# Patient Record
Sex: Female | Born: 1965 | Race: White | Hispanic: No | Marital: Married | Smoking: Never smoker
Health system: Southern US, Community
[De-identification: ages and names within clinical notes are randomized; demographics above are authoritative.]

## PROBLEM LIST (undated history)

## (undated) DIAGNOSIS — N83209 Unspecified ovarian cyst, unspecified side: Secondary | ICD-10-CM

## (undated) HISTORY — DX: Unspecified ovarian cyst, unspecified side: N83.209

---
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
## 1999-10-02 HISTORY — PX: EXPLORATORY LAPAROTOMY: SUR591

## 1999-10-17 ENCOUNTER — Other Ambulatory Visit: Admission: RE | Admit: 1999-10-17 | Discharge: 1999-10-17 | Payer: Self-pay | Admitting: Obstetrics and Gynecology

## 2000-03-29 ENCOUNTER — Encounter: Payer: Self-pay | Admitting: Obstetrics and Gynecology

## 2000-03-29 ENCOUNTER — Ambulatory Visit (HOSPITAL_COMMUNITY): Admission: RE | Admit: 2000-03-29 | Discharge: 2000-03-29 | Payer: Self-pay | Admitting: Obstetrics and Gynecology

## 2000-04-25 ENCOUNTER — Ambulatory Visit (HOSPITAL_COMMUNITY): Admission: RE | Admit: 2000-04-25 | Discharge: 2000-04-25 | Payer: Self-pay | Admitting: Obstetrics and Gynecology

## 2000-04-25 HISTORY — PX: PELVIC LAPAROSCOPY: SHX162

## 2000-09-03 ENCOUNTER — Encounter (INDEPENDENT_AMBULATORY_CARE_PROVIDER_SITE_OTHER): Payer: Self-pay

## 2000-09-03 ENCOUNTER — Inpatient Hospital Stay (HOSPITAL_COMMUNITY): Admission: RE | Admit: 2000-09-03 | Discharge: 2000-09-07 | Payer: Self-pay | Admitting: Obstetrics and Gynecology

## 2000-09-03 HISTORY — PX: ABDOMINAL SURGERY: SHX537

## 2000-09-03 HISTORY — PX: OOPHORECTOMY: SHX86

## 2000-09-11 ENCOUNTER — Inpatient Hospital Stay (HOSPITAL_COMMUNITY): Admission: RE | Admit: 2000-09-11 | Discharge: 2000-09-13 | Payer: Self-pay | Admitting: Obstetrics and Gynecology

## 2000-09-11 ENCOUNTER — Encounter: Payer: Self-pay | Admitting: Obstetrics and Gynecology

## 2000-11-04 ENCOUNTER — Other Ambulatory Visit: Admission: RE | Admit: 2000-11-04 | Discharge: 2000-11-04 | Payer: Self-pay | Admitting: Obstetrics and Gynecology

## 2003-07-20 ENCOUNTER — Other Ambulatory Visit: Admission: RE | Admit: 2003-07-20 | Discharge: 2003-07-20 | Payer: Self-pay | Admitting: Obstetrics and Gynecology

## 2007-04-14 ENCOUNTER — Emergency Department (HOSPITAL_COMMUNITY): Admission: EM | Admit: 2007-04-14 | Discharge: 2007-04-14 | Payer: Self-pay | Admitting: Family Medicine

## 2011-02-16 NOTE — Discharge Summary (Signed)
[facility]  Patient:    Sophia Martinez, Sophia Martinez                       MRN: [id number] Adm. Date:  [id number] Disc. Date: [id number] Attending:  Sharon Mt                           Discharge Summary  HISTORY OF PRESENT ILLNESS:   Patient is a 45 year old female who was admitted to the hospital with dense pelvic adhesive disease for exploratory surgery. On the day of admission she was taken to the operating room and exploratory laparotomy was done. Findings were dense adhesions of small and large bowel and to the pelvis. They were all systematically lysed. After this, peritubular paraovarian adhesions very encountered on the right. There was really not a salvageable adnexa on the right so she also underwent right salpingo- oophorectomy. Left tube was patent and left ovary appeared to be free of disease other than some pelvic adhesive disease.  Postoperatively she did  have an ileus which was understandable based on the findings of surgery and the amount of adhesiolysis done. She was kept on IVs, finally given a Dulcolax suppository, and by the fourth postoperative day, she was tolerating a normal diet, passing gas, and had a bowel movement.  Discharge diet was soft to advance as tolerated. Discharge activity was ambulatory. Wound care was routine. Her staples were removed when she was discharged.  FOLLOWUP:                     She is to return to our office in four weeks for further followup.  DISCHARGE MEDICATIONS:        Darvocet-N 100 on a p.r.n. basis for pain relief.  CONDITION ON DISCHARGE:       Improved.  DISCHARGE DIAGNOSIS:          1. Pelvic pain.                               2. Pelvic adhesive disease.  OPERATIONS:                   1. Exploratory laparotomy with lysis of small                                  and large bowel adhesions.                               2. Lysis of pelvic adhesions.                               3.  Right salpingo-oophorectomy. DD:  09/07/00 TD:  09/07/00 Job: [id number]

## 2011-02-16 NOTE — Op Note (Signed)
[facility]  Patient:    Sophia Martinez, Sophia Martinez                       MRN: [id number] Proc. Date: 09/03/00 Adm. Date:  [id number] Attending:  Sharon Mt                           Operative Report  PREOPERATIVE DIAGNOSES:       1. Chronic pelvic pain, especially right                                  lower quadrant.                               2. Pelvic inflammatory disease.                               3. Pelvic adhesions.                               4. Status post ruptured appendix.  POSTOPERATIVE DIAGNOSES:      1. Chronic pelvic pain, especially right                                  lower quadrant.                               2. Pelvic inflammatory disease.                               3. Pelvic adhesions.                               4. Status post ruptured appendix.  OPERATION:                    Exploratory laparotomy, lysis of small-bowel                               and pelvic adhesions, right                               salpingo-oophorectomy.  SURGEON:                      Daniel L. Eda Paschal, M.D.  ASSISTANT:                    Katy Fitch, M.D.  ANESTHESIA:                   General endotracheal anesthesia.  FINDINGS:                     At the time of laparotomy, findings were very similar to what was seen when she had had her previous laparoscopy.  The uterus was adherent posteriorly with obliteration of the cul-de-sac to the sigmoid colon.  The patients left fallopian tube was adherent to sigmoid colon.  Her tube, which had been opened at the time of laparoscopy, was still patent.  Fimbria could be seen when indigo carmine was introduced.  There was spill of dye from the left tube with a completely patent tube.  The patients left ovary was encased in dense adhesions and was not really amenable to freeing up without risk of ruining blood supply to the ovary and/or bowel injury.  In the right lower quadrant the  patient had multiple loops of small-bowel adherent to the adnexa and to the right lateral sidewall.  As these adhesions were freed up, there was evidence of small-bowel doubling over on itself and area where small-bowel kissed itself consistent with her previous ruptured appendix.  The right fallopian tube had a severely dilated hydrosalpinx.  The ovary was densely adherent both to the tube and to the sidewall and it was not felt that either were salvageable short of risking continuation of a chronic pelvic pain.  The ileocecal junction was identified and the cecum itself was normal.  The omentum was densely adherent both to the parietal peritoneum and to the above structures.  ESTIMATED BLOOD LOSS:         200 cc.  DESCRIPTION OF PROCEDURE:     After adequate general endotracheal anesthesia, the patient was placed in the supine position, prepped and draped in the usual sterile manner.  A Foley catheter was inserted in the patients bladder.  A pediatric Foley was put into the uterus to inject dye.  A Pfannenstiel incision was made. The fascia was opened transversely. The peritoneum was entered vertically. Subcutaneous bleeders were clamped and bovied as encountered.  When the peritoneal cavity was opened, the above findings were noted.  Taking careful care, all small-bowel adhesions were sharply lysed. Not only did it take significant time to free up the small-bowel from the right lower quadrant and the right lateral wall, but in addition, it had to be freed up from itself as it was doubled over on itself and certainly could be a major source of her right lower quadrant pain.  Omental adhesions also had to be lysed in order to proceed with the above. The total time of this part of the procedure was at least an hour and a half. The sigmoid colon was then carefully freed up from the left adnexa and from the posterior wall of the uterus so that the patient now had a free cul-de-sac.  This  took significant time as well.  The left tube was examined.  It was still patent from a previous surgery and fimbria looked normal. There really was no way to create any kind of plane to free the ovary up, so although the patient now had a patent tube on the left, it was felt that her chances of conceiving were diminished because of the inability to get normal oviductal transport.  It was felt that any further dissection would severely risk losing the left ovary and because of the severe chronic PID on the right including the hydrosalpinx and the patients persistent right lower quadrant pain, it was felt that a right salpingo-oophorectomy had to be performed and therefore that was done next. The ureter could be identified by opening the peritoneal space on the right. The round ligament was clamped, cut, and suture ligated with 0 Vicryl.  After the ureter had been identified and isolated, the right infundibulopelvic ligament was clamped, cut and doubly suture ligated with  0 Vicryl.  The ovary and tube were freed up. This was very difficult because of the dense adhesions.  It was felt that the entire ovary was removed; however, because of all the fibrous scar tissue, it certainly was possible that a small ovarian remnant remains on her right side. The rest of the vascular attachments were clamped, cut, and suture ligated with 0 Vicryl.  Copious irrigation was done with Ringers lactate.  Two sponge, needle and instrument counts were correct. A significant amount of fluid was left in the peritoneal cavity to float the bowel to try to prevent readherence and then the peritoneum was closed with 0 Vicryl.  Sub- and superfascial spaces were copiously irrigated with Ringers lactate and then the fascia was closed with two running o Vicryl sutures and the skin was closed with staples.  The estimated blood loss for the entire procedure was 200 cc with none replaced.  The patient tolerated the  procedure well and left the operating room in satisfactory condition draining clear urine from her Foley catheter. DD:  09/03/00 TD:  09/03/00 Job: [id number]

## 2011-02-16 NOTE — Discharge Summary (Signed)
[facility]  Patient:    Sophia Martinez, Sophia Martinez                       MRN: [id number] Adm. Date:  [id number] Disc. Date: [id number] Attending:  Sharon Mt Dictator:   Antony Contras, [facility]                           Discharge Summary  DISCHARGE DIAGNOSES:          1. Chronic pelvic pain, especially right lower quadrant.                               2. Pelvic inflammatory disease.                               3. Pelvic adhesions.                               4. Status post ruptured appendix.  PROCEDURES:                   Exploratory laparotomy, lysis of small bowel and pelvic adhesions, right salpingo-oophorectomy.  HISTORY OF PRESENT ILLNESS:   The patient is a 45 year old nulligravida, who enters the hospital with persistent right lower quadrant pain that radiates to her right leg, and this usually occurs the week before her period, but she also has it with menses, and it is becoming increasingly uncomfortable and has been associated with dyspareunia.  When she was 45 years old, she did have a severe ruptured appendix which apparently was ruptured for about a week before she had surgery.  She also did have severe gangrene of the appendix as well as generalized peritonitis, and she was at one point critically ill.  She did have a laparoscopy in July 2001, which showed dense pelvic adhesions involving the ovaries, fallopian tubes, small and large bowel.  During this procedure, a significant amount of adhesions involving the left adnexa were lysed but because of the denseness of the adhesions and the inability to visualize the right adnexa, it was thought possible to free up the right side, and she continued to have pain.  She now presents for exploratory laparotomy with a possible right salpingo-oophorectomy.  HOSPITAL COURSE AND TREATMENT:                The patient was admitted on September 03, 2000, where exploratory laparotomy, lysis of small  bowel and pelvic adhesions, and right salpingo-oophorectomy was performed by Dr. Eda Paschal, assisted by Dr. Penni Homans, under general endotracheal anesthesia.  Estimated blood loss was 200 cc.  The patient tolerated the procedure well.  Postoperative temperature maximum was 100.9.  She did have no difficulty voiding, but she did have some difficulty passing flatus.  This was relieved with Dulcolax suppository.  Her CBC with hematocrit 31.7, hemoglobin 11.2, WBC 6.2, platelets 266.  She was able to be discharged on her fourth postoperative day in satisfactory condition.  DISPOSITION:                  The patient did leave the hospital prior to receiving discharge instructions from the nursing staff.  Staff member attempted to call patient, but the contact phone number was a cell phone, and the recording said  she was unavailable.  The patient had been instructed by Dr. Eda Paschal to follow up in the office postoperatively. DD:  10/07/00 TD:  10/07/00 Job: [id number]

## 2011-02-16 NOTE — H&P (Signed)
[facility]  Patient:    Sophia Martinez, Sophia Martinez                       MRN: [id number] Attending:  Rande Brunt. Eda Paschal, M.D.                         History and Physical  CHIEF COMPLAINT:              Pelvic pain with pelvic adhesive disease.  HISTORY OF PRESENT ILLNESS:   The patient is a 45 year old nulligravida who enters the hospital with persistent right lower quadrant pain that radiates to her right leg.  It usually occurs the week before her period, but she also has it with her period.  It is becoming increasingly uncomfortable for her.  It has been associated with dyspareunia.  Her history is significant in that, when she was 45 years old, she had a severely ruptured appendix, which evidently stayed ruptured for about a week before she had surgery.  When they did her surgery, she had severe gangrene of the appendix as well as generalized peritonitis, and there were some concerns about whether she was going to survive.  As a result of this, she underwent open laparoscopy by me in July 2001.  The findings were dense pelvic adhesive disease involving the ovaries, fallopian tubes, small and large bowel.  Through the laparoscope, we were able to lyse a significant amount of adhesions involving her left adnexa but, because of the denseness of the adhesions and the inability to even visualize the right adnexa, it was not possible to free up the right side at all, and she has continued to have pain after her surgery.  She now enters for definitive surgery.  Because she would like to have children, she will not undergo total abdominal hysterectomy and bilateral salpingo-oophorectomy, but we will lyse adhesions.  If it is possible to open a blocked fallopian tube, we will do so.  She has also given me permission to remove her right ovary and tube if it is not salvageable so that she will not have to deal with the above pain.  PAST MEDICAL HISTORY:          Ruptured appendix at 45 years old.  PRESENT MEDICATIONS:          None.  ALLERGIES:                    None.  FAMILY HISTORY:               Uterine cancer on her mothers side.  SOCIAL HISTORY:               She is a nonsmoker and nondrinker.  REVIEW OF SYSTEMS:            HEENT: Negative except for headaches.  CARDIAC: Negative.  RESPIRATORY: Negative.  GASTROINTESTINAL: Negative except for lower abdominal pain.  GENITOURINARY: Negative.  NEUROMUSCULAR: Negative. ENDOCRINE: Negative.  PHYSICAL EXAMINATION:  GENERAL:                      Well-developed, well-nourished female in no acute distress.  VITAL SIGNS:                  Blood pressure 110/70, pulse 80 and regular, respirations 16 and unlabored.  She is afebrile.  HEENT:  Within normal limits.  NECK:                         Supple.  Trachea midline.  Thyroid not enlarged.  LUNGS:                        Clear to P&A.  HEART:                        No thrills, heaves or murmurs.  BREASTS:                      No masses.  ABDOMEN:                      Soft without guarding, rebound or masses.  PELVIC:                       External and vaginal is within normal limits. The cervix is clean.  Pap was obtained.  The uterus is mid position, normal size and shape, somewhat fixed.  The right adnexa is slightly enlarged and tender.  The left adnexa is palpably normal.  Rectovaginal is confirmatory.  EXTREMITIES:                  Within normal limits.  ADMISSION IMPRESSION:         1. Chronic pelvic pain, right sided.                               2. Pelvic adhesive disease secondary to ruptured                                  appendix.  PLAN:                         Exploratory laparotomy with appropriate surgery. DD:  09/03/00 TD:  09/03/00 Job: [id number]

## 2011-02-16 NOTE — H&P (Signed)
[facility]  Patient:    Sophia Martinez, Sophia Martinez                       MRN: [id number] Adm. Date:  [id number] Attending:  Sharon Mt                         History and Physical  CHIEF COMPLAINT:  Nausea and vomiting.  HISTORY OF PRESENT ILLNESS:  The patient is a ______-year-old female who is now nine days status post exploratory laparotomy with multiple lysis of small bowel adhesions in the pelvis as well as right salpingo-oophorectomy, who had been discharged doing well, passing gas, having bowel movements, and without nausea and vomiting.  The day prior to this admission, she started noticing some increased constipation.  She went to sleep and she woke up at 2 in the morning and from 2 oclock from 10 oclock when I saw her in the office, she had persistent nausea and vomiting.  She denies any fever.  She denies any pain other than discomfort in her stomach from continually throwing up. Findings at the time of her surgery, nine days ago, was massive small-bowel adhesions into the pelvis as a result of a previous ruptured appendix with peritonitis that was sustained when she was 45 years old.  The patient is also having no urinary symptoms.  PAST MEDICAL HISTORY:  See above and see previous history and physical from admission nine days ago.  FAMILY HISTORY:  Noncontributory.  SOCIAL HISTORY:  Noncontributory.  REVIEW OF SYSTEMS:  HEENT:  Negative.  CARDIAC:  Negative.  RESPIRATORY: Negative.  GASTROINTESTINAL:  See above.  GENITOURINARY:  See above. NEUROMUSCULAR:  Negative.  ENDOCRINE:  Negative.  PHYSICAL EXAMINATION:  GENERAL:  The patient is a ill-appearing female, having severe nausea and vomiting.  VITAL SIGNS:  Blood pressure is 130/70, pulse is 80 and regular, temperature is 98.2, respirations are 16 and nonlabored.  HEENT:  Within normal limits.  NECK:  Supple.  Trachea in the midline.  Thyroid is not enlarged.  LUNGS:  Clear to  P&A.  HEART:  No thrills, heaves, or murmurs.  BREASTS:  No masses.  ABDOMEN:  Appears somewhat distended.  Breath sounds are actually normoactive in spite of the distention.  Incision is healing well.  PELVIC:  External and vaginal within normal limits.  Cervix is clean.  Uterus is anteverted, normal size and shape.  Adnexa are palpable normal. Rectovaginal is confirmatory.  ADMITTING IMPRESSION:  Postoperative nausea and vomiting with partial small-bowel obstruction, suspected due to previous surgery.  PLAN:  Admission with IVs.  Electrolytes and abdominal films to be obtained as soon as she is admitted. DD:  09/12/00 TD:  09/12/00 Job: [id number]

## 2011-02-16 NOTE — Op Note (Signed)
[facility]  Patient:    Sophia Martinez, Sophia Martinez                       MRN: [id number] Proc. Date: 04/25/00 Adm. Date:  [id number] Disc. Date: [id number] Attending:  Sharon Mt                           Operative Report  PREOPERATIVE DIAGNOSIS:       Pelvic pain, dyspareunia, and probable                               hydrosalpinx bilaterally based on ultrasound.  POSTOPERATIVE DIAGNOSIS:      Pelvic pain, pelvic adhesions, and bilateral                               hydrosalpinx.  OPERATION:                    Diagnostic laparoscopy with lysis of multiple                               pelvic adhesions, left salpingoneostomy.  SURGEON:                      Daniel L. Eda Paschal, M.D.  ASSISTANT:                    Douglass Rivers, M.D.  ANESTHESIA:                   General endotracheal.  FINDINGS:                     At the time of laparoscopy, the patient had significant pelvic adhesions.  There were some omental adhesions in the upper part of the abdomen which did not interfere with the procedure.  There were omental adhesions in the pelvis.  However, there were multiple loops of both small and large bowel that were adherent to the uterus into the left and right adnexa.  After extensive dissection, the left adnexa could be identified and the patient had a hydrosalpinx.  The left ovary was almost completely encased in adhesions and was retroperitoneal and could be seen, although was never completely uncovered.  On the right side, even after all small and large bowel adhesions had been lysed, the patient had no evidence of a right tube and ovary, and it was felt that they were both retroperitoneal and were encased in multiple adhesions.  There would really be no way without a laparotomy to dissect out the tube, and to Sophia Martinez with a right hydrosalpinx prior to an other investigation.  DESCRIPTION OF PROCEDURE:     After adequate general endotracheal  anesthesia, the patient was placed in the dorsolithotomy position, prepped and draped in the usual sterile manner.  A Jarcho cannula was inserted into the uterus.  The bladder was emptied with a Robinson catheter.  It was felt that it would be safer to do an open laparoscopy, and therefore, that was what was done.  A midline incision was made below the umbilicus and carried through the fascia. The fascia was then whip stitched with a 0 Vicryl suture.  The peritoneum was identified, entered with sharp  dissection, and a Hulka catheter was placed, and tied in place with a 0 Vicryl suture.  A pneumoperitoneum was created.  A 10 mm operating laparoscope attached to a camera was placed through this incision.  Two 5 mm ports were placed in the lower pelvis, one in the midline, and one in the left lower quadrant.  Through this, multiple instrumentation was placed during the procedure.  Through the operating channel of the scope, a ______ neodymium YAG laser was utilized with a GR6 foster tip over the bare fiber at 12 watts power and a ________ was used through this incision. Copious irrigation with Ringers lactate was done throughout the procedure.                                First, all the loops of bowel that were adherent to the pelvis were freed up, and this could be done carefully and meticulously, although it took approximately an hour of careful dissection to do so.  The laser was utilized throughout to do this.                                Once this had been accomplished, the left tube could easily be identified using the neodymium YAG laser, the distal portion of the tube could be identified where it was closed.  Indigo Carmine was introduced through the cervix.  It spilled to the end of the tube where it was closed, and then the tube was nicely opened with the YAG laser in such a fashion that it came pouring out.  The patient actually had very good fimbria on the left side.  The  area could be ________ back nicely, and it appeared that she had a nice patent tube on the left.                                In spite of multiple attempts, another 30-40 minutes of dissection on the right, it was never ever able to [facility] the right adnexa as it appeared to be severely retroperitoneal without any good tissue planes, and as adhesions were lysed, another layer of adhesions was encountered, and it was finally felt that this was beyond the scope of what the procedure could accomplish.  The procedure was therefore terminated. Copious irrigation was once more used with Ringers lactate.  All cul-de-sac fluid was removed.  The pneumoperitoneum was evacuated.  The 0 Vicryl suture subumbilically was tied in place and the three skin incisions were closed with   3-0 Monocryl.                                Estimated blood loss for the entire procedure was less than 50 cc and none replaced.  The patient tolerated the procedure well and left the operating room in satisfactory condition. DD:  04/25/00 TD:  04/28/00 Job: [id number]

## 2011-12-25 ENCOUNTER — Other Ambulatory Visit (HOSPITAL_COMMUNITY)
Admission: RE | Admit: 2011-12-25 | Discharge: 2011-12-25 | Disposition: A | Discharge status: Home / Self Care | Payer: 59 | Source: Ambulatory Visit | Attending: Obstetrics and Gynecology | Admitting: Obstetrics and Gynecology

## 2011-12-25 ENCOUNTER — Ambulatory Visit (INDEPENDENT_AMBULATORY_CARE_PROVIDER_SITE_OTHER): Payer: 59 | Admitting: Obstetrics and Gynecology

## 2011-12-25 ENCOUNTER — Encounter: Payer: Self-pay | Admitting: Obstetrics and Gynecology

## 2011-12-25 VITALS — BP 130/80 | Ht 64.0 in | Wt 148.0 lb

## 2011-12-25 DIAGNOSIS — N83209 Unspecified ovarian cyst, unspecified side: Secondary | ICD-10-CM | POA: Insufficient documentation

## 2011-12-25 DIAGNOSIS — Z833 Family history of diabetes mellitus: Secondary | ICD-10-CM

## 2011-12-25 DIAGNOSIS — Z01419 Encounter for gynecological examination (general) (routine) without abnormal findings: Secondary | ICD-10-CM

## 2011-12-25 DIAGNOSIS — N912 Amenorrhea, unspecified: Secondary | ICD-10-CM

## 2011-12-25 LAB — CBC WITH DIFFERENTIAL/PLATELET
Basophils Absolute: 0 K/uL (ref 0.0–0.1)
Basophils Relative: 0 % (ref 0–1)
Eosinophils Absolute: 0.1 K/uL (ref 0.0–0.7)
Eosinophils Relative: 2 % (ref 0–5)
HCT: 41.8 % (ref 36.0–46.0)
Hemoglobin: 13.6 g/dL (ref 12.0–15.0)
Lymphocytes Relative: 23 % (ref 12–46)
Lymphs Abs: 1.7 K/uL (ref 0.7–4.0)
MCH: 31.6 pg (ref 26.0–34.0)
MCHC: 32.5 g/dL (ref 30.0–36.0)
MCV: 97.2 fL (ref 78.0–100.0)
Monocytes Absolute: 0.7 K/uL (ref 0.1–1.0)
Monocytes Relative: 9 % (ref 3–12)
Neutro Abs: 4.7 K/uL (ref 1.7–7.7)
Neutrophils Relative %: 66 % (ref 43–77)
Platelets: 323 K/uL (ref 150–400)
RBC: 4.3 MIL/uL (ref 3.87–5.11)
RDW: 12.1 % (ref 11.5–15.5)
WBC: 7.1 K/uL (ref 4.0–10.5)

## 2011-12-25 LAB — HDL CHOLESTEROL: HDL: 69 mg/dL

## 2011-12-25 LAB — CHOLESTEROL, TOTAL: Cholesterol: 179 mg/dL (ref 0–200)

## 2011-12-25 LAB — TSH: TSH: 0.798 u[IU]/mL (ref 0.350–4.500)

## 2011-12-25 LAB — FOLLICLE STIMULATING HORMONE: FSH: 82 m[IU]/mL

## 2011-12-25 MED ORDER — MEDROXYPROGESTERONE ACETATE 10 MG PO TABS: ORAL_TABLET | ORAL | Status: DC

## 2011-12-25 MED ORDER — ESTRADIOL 1 MG PO TABS: 1.0000 mg | ORAL_TABLET | Freq: Every day | ORAL | Status: DC

## 2011-12-25 NOTE — Progress Notes (Signed)
Patient is a 46 year old nulligravida who came to see me as a new patient for an annual exam and problem visit. She used to be a patient of mine but I have not seen her since 2004. She is continued routine GYN exams in [place] where she lives. Starting 3 years ago she had oligo amenorrhea with 1-3 periods a year. This was continued. She has had progressive weight gain, hot flashes, and vaginal dryness. She cannot sleep at night because of these symptoms. She had a mammogram last year. She has also had trouble breathing which she attributes to the weight gain. Her BMI is 25 so I don't believe that is the cause of her respiratory distress. When I used to see her for infertility we did a laparoscopy with a right ovarian cystectomy for a benign ovarian cyst. It was not endometriosis. We have had a go back later and remove her right ovary and tube for either adhesions or recurrent cysts. We will get her record and review it.  Physical examination:Kim Julian Reil present. HEENT within normal limits. Neck: Thyroid not large. No masses. Supraclavicular nodes: not enlarged. Breasts: Examined in both sitting and lying  position. No skin changes and no masses. Abdomen: Soft no guarding rebound or masses or hernia. Pelvic: External: Within normal limits. BUS: Within normal limits. Vaginal:within normal limits. Good estrogen effect. No evidence of cystocele rectocele or enterocele. Cervix: clean. Uterus: Normal size and shape. Adnexa: No masses. Rectovaginal exam: Confirmatory and negative. Extremities: Within normal limits.  Assessment: Menopausal symptoms. Dyspnea.  Plan: Appropriate lab work done. We will call patient with results. Start her on estradiol 1 mg daily. She will do medroxyprogesterone 5 mg day 1-12. She will start that in May. She will call in 6 weeks if not better. She will get her yearly mammogram. We referred her to clint young today for pulmonary evaluation.

## 2011-12-26 ENCOUNTER — Telehealth: Payer: Self-pay | Admitting: Internal Medicine

## 2011-12-26 LAB — URINALYSIS W MICROSCOPIC + REFLEX CULTURE
Bacteria, UA: NONE SEEN
Bilirubin Urine: NEGATIVE
Casts: NONE SEEN
Crystals: NONE SEEN
Glucose, UA: NEGATIVE mg/dL
Hgb urine dipstick: NEGATIVE
Ketones, ur: NEGATIVE mg/dL
Leukocytes, UA: NEGATIVE
Nitrite: NEGATIVE
Protein, ur: NEGATIVE mg/dL
Specific Gravity, Urine: 1.012 (ref 1.005–1.030)
Squamous Epithelial / HPF: NONE SEEN
Urobilinogen, UA: 0.2 mg/dL (ref 0.0–1.0)
pH: 5.5 (ref 5.0–8.0)

## 2011-12-26 LAB — HEMOGLOBIN A1C
Hgb A1c MFr Bld: 5.4 % (ref <5.7)
Mean Plasma Glucose: 108 mg/dL (ref <117)

## 2011-12-26 NOTE — Telephone Encounter (Signed)
Per CY-okay to work patient in sooner than May 2013 appt. I have called the patient and scheduled her to see CY on 01-21-2012 at 1115am appt.

## 2011-12-26 NOTE — Telephone Encounter (Signed)
Called, spoke with pt.  Pt reports she "gets out of breath just talking" x 2 yrs and has a prod cough that "comes and goes."  Cough is prod with thick, dark yellow mucus.  At times, mucus will have streaks of dark red blood mixed in.  Reports this happened last yesterday.  Has been seen by Mauricio Po, PA for this during the past 2 yrs and was given prednisone.  No CXR has been done per pt.  Thought this was from weight gain but states was told by Dr. Eda Paschal at [facility] yesterday that this in not from just weight gain and rec she call to schedule appt with Dr. Maple Hudson.  His first available in not until May.  Can Dr. Maple Hudson see pt sooner?  Pls advise.    Note:  She does not want to see anyone other than Dr. Maple Hudson.  I advised emergency care/PCP if symptoms worsen or needs to be seen immediately.  She verbalized understanding of this.

## 2011-12-27 ENCOUNTER — Encounter: Payer: Self-pay | Admitting: Gynecology

## 2012-01-21 ENCOUNTER — Ambulatory Visit (INDEPENDENT_AMBULATORY_CARE_PROVIDER_SITE_OTHER)
Admission: RE | Admit: 2012-01-21 | Discharge: 2012-01-21 | Disposition: A | Discharge status: Home / Self Care | Payer: 59 | Source: Ambulatory Visit | Attending: Internal Medicine | Admitting: Internal Medicine

## 2012-01-21 ENCOUNTER — Other Ambulatory Visit (INDEPENDENT_AMBULATORY_CARE_PROVIDER_SITE_OTHER): Payer: 59

## 2012-01-21 ENCOUNTER — Ambulatory Visit (INDEPENDENT_AMBULATORY_CARE_PROVIDER_SITE_OTHER): Payer: 59 | Admitting: Internal Medicine

## 2012-01-21 ENCOUNTER — Encounter: Payer: Self-pay | Admitting: Internal Medicine

## 2012-01-21 VITALS — BP 118/80 | HR 74 | Ht 64.0 in | Wt 149.4 lb

## 2012-01-21 DIAGNOSIS — R042 Hemoptysis: Secondary | ICD-10-CM

## 2012-01-21 DIAGNOSIS — R0989 Other specified symptoms and signs involving the circulatory and respiratory systems: Secondary | ICD-10-CM

## 2012-01-21 DIAGNOSIS — R0600 Dyspnea, unspecified: Secondary | ICD-10-CM

## 2012-01-21 DIAGNOSIS — R0609 Other forms of dyspnea: Secondary | ICD-10-CM

## 2012-01-21 LAB — BASIC METABOLIC PANEL
CO2: 27 mEq/L (ref 19–32)
Calcium: 9.2 mg/dL (ref 8.4–10.5)
Creatinine, Ser: 0.7 mg/dL (ref 0.4–1.2)
GFR: 91.19 mL/min (ref >60.00)
Sodium: 140 mEq/L (ref 135–145)

## 2012-01-21 LAB — CBC WITH DIFFERENTIAL/PLATELET
Basophils Absolute: 0 10*3/uL (ref 0.0–0.1)
Eosinophils Absolute: 0.1 10*3/uL (ref 0.0–0.7)
HCT: 40.1 % (ref 36.0–46.0)
Lymphs Abs: 1.4 10*3/uL (ref 0.7–4.0)
MCHC: 33.5 g/dL (ref 30.0–36.0)
Monocytes Relative: 13.3 % — ABNORMAL HIGH (ref 3.0–12.0)
Neutro Abs: 2 10*3/uL (ref 1.4–7.7)
Platelets: 285 10*3/uL (ref 150.0–400.0)
RDW: 12.6 % (ref 11.5–14.6)

## 2012-01-21 NOTE — Patient Instructions (Signed)
Order- CXR   Dx dyspnea              Lab- CBC w/ diff, BMET, BNP, D-dimer       Dx dyspnea   Order- PFT  and 6 MWT   Dx dyspnea  Ok if needed to use an otc antihistamine like Claritin/ loratadine for allergy, runny nose, sneezing.

## 2012-01-21 NOTE — Progress Notes (Signed)
01/21/12- 42 yoF never smoker and referred courtesy of Dr. Oletha Blend and for pulmonary evaluation because of shortness of breath. PCP Mauricio Po, NP in [place]. She has noticed increased shortness of breath for at least one year. Over about the same time she has gained weight, probably 30 pounds in 2 years. She notices dyspnea with exertion but wheezes only if sick. Sneezing hurts upper anterior chest wall, otherwise no chest pain. She blames grass, cold air and pollen for increasing shortness of breath and chest tightness. She does give history of hayfever. She notices daily cough, not always productive. Sputum has been white, sometimes with thin blood-streaked. Pneumonia in the past was treated as an outpatient. There is history of deep vein thrombosis in mother and 2 sisters on Coumadin. A unifying coagulopathy is not known. She denies history of heart disease. She takes 2 or 3 Advil per day for chronic headache. Shellfish cause hives. Surgery for appendicitis and right oophorectomy. She recently started hormone replacement and considers herself menopausal. Prior to Admission medications   Medication Sig Start Date End Date Taking? Authorizing Provider  estradiol (ESTRACE) 1 MG tablet Take 1 tablet (1 mg total) by mouth daily. 12/25/11 12/24/12 Yes Trellis Paganini, MD  medroxyPROGESTERone (PROVERA) 10 MG tablet One daily first 12 days of month. 12/25/11  Yes Trellis Paganini, MD   Past Medical History  Diagnosis Date  . Ovarian cyst    Past Surgical History  Procedure Date  . Appendectomy   . Oophorectomy 12-4 2001    RSO  . Pelvic laparoscopy 04-25-2000    Lysis of pelvic adhesions-Left salpingostomy  . Abdominal surgery 09-03-2000    Exploratory laparotomy -lysis bowel adhesions-Right salpingophorectomy   Family History  Problem Relation Age of Onset  . Cancer Maternal Grandmother   . Diabetes Mother    History   Social History  . Marital Status: Married    Spouse Name: N/A   Number of Children: 0  . Years of Education: N/A   Occupational History  . Not on file.   Social History Main Topics  . Smoking status: Never Smoker   . Smokeless tobacco: Not on file  . Alcohol Use: No  . Drug Use: No  . Sexually Active: Yes    Birth Control/ Protection: None   Other Topics Concern  . Not on file   Social History Narrative  . No narrative on file   ROS-see HPI Constitutional:   No-   weight loss, night sweats, fevers, chills, fatigue, lassitude. HEENT:   +  headaches,  No-difficulty swallowing, tooth/dental problems, sore throat,       No-  sneezing, itching, ear ache, nasal congestion, post nasal drip,  CV:  No-   chest pain, orthopnea, PND, swelling in lower extremities, anasarca, dizziness, palpitations Resp: +  shortness of breath with exertion or at rest.             +  productive cough,  + non-productive cough,  Trace occasional coughing up of blood.              No-   change in color of mucus.  No- wheezing.   Skin: No-   rash or lesions. GI:  No-   heartburn, indigestion, abdominal pain, nausea, vomiting,  GU: No-   dysuria, MS:  No-   joint pain or swelling. Neuro-     nothing unusual Psych:  No- change in mood or affect. No depression or anxiety.  No memory loss.  OBJ- Physical Exam General- Alert, Oriented, Affect-appropriate, Distress- none acute Skin- rash-none, lesions- none, excoriation- none Lymphadenopathy- none Head- atraumatic            Eyes- Gross vision intact, PERRLA, conjunctivae and secretions clear            Ears- Hearing, canals-normal            Nose- Clear, no-Septal dev, mucus, polyps, erosion, perforation             Throat- Mallampati II , mucosa clear , drainage- none, tonsils- atrophic Neck- flexible , trachea midline, no stridor , thyroid nl, carotid no bruit Chest - symmetrical excursion , unlabored           Heart/CV- RRR , no murmur , no gallop  , no rub, nl s1 s2                           - JVD- none , edema-  none, stasis changes- none, varices- none           Lung- clear to P&A, wheeze- none, cough- none , dullness-none, rub- none           Chest wall-  Abd- tender-no, distended-no, bowel sounds-present, HSM- no Br/ Gen/ Rectal- Not done, not indicated Extrem- cyanosis- none, clubbing, none, atrophy- none, strength- nl, Neg Homan's Neuro- grossly intact to observation

## 2012-01-22 LAB — BRAIN NATRIURETIC PEPTIDE: Pro B Natriuretic peptide (BNP): 13 pg/mL (ref 0.0–100.0)

## 2012-01-22 LAB — D-DIMER, QUANTITATIVE: D-Dimer, Quant: 0.22 ug/mL-FEU (ref 0.00–0.48)

## 2012-01-22 NOTE — Progress Notes (Signed)
Quick Note:  LMTCB ______ 

## 2012-01-23 ENCOUNTER — Ambulatory Visit: Payer: 59

## 2012-01-24 DIAGNOSIS — R042 Hemoptysis: Secondary | ICD-10-CM | POA: Insufficient documentation

## 2012-01-24 NOTE — Assessment & Plan Note (Signed)
In it is not yet clear that this represents more than her weight gain. Oxygenation was quite normal on overnight oximetry. 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 Plan chest x-ray, D. dimer, chemistry panel, CBC, BNP.

## 2012-01-24 NOTE — Assessment & Plan Note (Signed)
Small streaks of hemoptysis associated with hard coughing at times, may be benign hemorrhagic bronchitis in a never smoker. Plan- await initial labs.

## 2012-01-31 ENCOUNTER — Encounter: Payer: Self-pay | Admitting: *Deleted

## 2012-01-31 NOTE — Progress Notes (Signed)
Quick Note:  After several attempts to call patient with no answer or call back; will send letter to patients home address to contact us for results. ______ 

## 2012-01-31 NOTE — Progress Notes (Signed)
Quick Note:  After several attempts to call patient with no answer or call back; will send letter to patients home address to contact us for results. ______

## 2012-07-28 ENCOUNTER — Telehealth: Payer: Self-pay | Admitting: *Deleted

## 2012-07-28 NOTE — Telephone Encounter (Signed)
Pt called c/o hot flashes and night sweats while taking estradiol 1 mg tablets. She was told to call to follow up if medication not working. Please advise

## 2012-07-28 NOTE — Telephone Encounter (Signed)
Pt informed with the below note. 

## 2012-07-28 NOTE — Telephone Encounter (Signed)
Increase estradiol to 1.5 mg daily. She can do this by taking one and a half pills daily. Continue Provera as above. Let  me know how she's doing.

## 2012-12-18 ENCOUNTER — Encounter: Payer: Self-pay | Admitting: Anesthesiology

## 2012-12-25 ENCOUNTER — Encounter: Payer: 59 | Admitting: Gynecology

## 2013-03-12 ENCOUNTER — Ambulatory Visit (INDEPENDENT_AMBULATORY_CARE_PROVIDER_SITE_OTHER): Payer: BC Managed Care – PPO | Admitting: Gynecology

## 2013-03-12 ENCOUNTER — Encounter: Payer: Self-pay | Admitting: Gynecology

## 2013-03-12 VITALS — BP 120/80 | Ht 63.0 in | Wt 149.0 lb

## 2013-03-12 DIAGNOSIS — Z79890 Hormone replacement therapy (postmenopausal): Secondary | ICD-10-CM

## 2013-03-12 DIAGNOSIS — Z8639 Personal history of other endocrine, nutritional and metabolic disease: Secondary | ICD-10-CM

## 2013-03-12 DIAGNOSIS — Z01419 Encounter for gynecological examination (general) (routine) without abnormal findings: Secondary | ICD-10-CM

## 2013-03-12 DIAGNOSIS — R635 Abnormal weight gain: Secondary | ICD-10-CM | POA: Insufficient documentation

## 2013-03-12 LAB — CBC WITH DIFFERENTIAL/PLATELET
Basophils Relative: 1 % (ref 0–1)
Eosinophils Absolute: 0.1 10*3/uL (ref 0.0–0.7)
Eosinophils Relative: 2 % (ref 0–5)
MCH: 31.2 pg (ref 26.0–34.0)
MCHC: 33.6 g/dL (ref 30.0–36.0)
MCV: 92.9 fL (ref 78.0–100.0)
Neutrophils Relative %: 50 % (ref 43–77)
Platelets: 363 10*3/uL (ref 150–400)
RDW: 13 % (ref 11.5–15.5)

## 2013-03-12 LAB — HEMOGLOBIN A1C: Hgb A1c MFr Bld: 5.4 % (ref <5.7)

## 2013-03-12 MED ORDER — ESTRADIOL 1 MG PO TABS: ORAL_TABLET | ORAL | Status: AC

## 2013-03-12 MED ORDER — MEDROXYPROGESTERONE ACETATE 10 MG PO TABS: ORAL_TABLET | ORAL | Status: AC

## 2013-03-12 NOTE — Patient Instructions (Addendum)
Patient information: High cholesterol (The Basics)  What is cholesterol? - Cholesterol is a substance that is found in the blood. Everyone has some. It is needed for good health. The problem is, people sometimes have too much cholesterol. Compared with people with normal cholesterol, people with high cholesterol have a higher risk of heart attacks, strokes, and other health problems. The higher your cholesterol, the higher your risk of these problems. Cholesterol levels in your body are determined significantly by your diet. Cholesterol levels may also be related to heart disease. The following material helps to explain this relationship and discusses what you can do to help keep your heart healthy. Not all cholesterol is bad. Low-density lipoprotein (LDL) cholesterol is the "bad" cholesterol. It may cause fatty deposits to build up inside your arteries. High-density lipoprotein (HDL) cholesterol is "good." It helps to remove the "bad" LDL cholesterol from your blood. Cholesterol is a very important risk factor for heart disease. Other risk factors are high blood pressure, smoking, stress, heredity, and weight.  The heart muscle gets its supply of blood through the coronary arteries. If your LDL cholesterol is high and your HDL cholesterol is low, you are at risk for having fatty deposits build up in your coronary arteries. This leaves less room through which blood can flow. Without sufficient blood and oxygen, the heart muscle cannot function properly and you may feel chest pains (angina pectoris). When a coronary artery closes up entirely, a part of the heart muscle may die, causing a heart attack (myocardial infarction).  CHECKING CHOLESTEROL When your caregiver sends your blood to a lab to be analyzed for cholesterol, a complete lipid (fat) profile may be done. With this test, the total amount of cholesterol and levels of LDL and HDL are determined. Triglycerides  are a type of fat that circulates in the blood and can also be used to determine heart disease risk. Are there different types of cholesterol? - Yes, there are a few different types. If you get a cholesterol test, you may hear your doctor or nurse talk about: Total cholesterol  LDL cholesterol - Some people call this the "bad" cholesterol. That's because having high LDL levels raises your risk of heart attacks, strokes, and other health problems.  HDL cholesterol - Some people call this the "good" cholesterol. That's because having high HDL levels lowers your risk of heart attacks, strokes, and other health problems.  Non-HDL cholesterol - Non-HDL cholesterol is your total cholesterol minus your HDL cholesterol.  Triglycerides - Triglycerides are not cholesterol. They are a type of fat. But they often get measured when cholesterol is measured. (Having high triglycerides also seems to increase the risk of heart attacks and strokes.)   Keep in mind, though, that many people who cannot meet these goals still have a low risk of heart attacks and strokes. What should I do if my doctor tells me I have high cholesterol? - Ask your doctor what your overall risk of heart attacks and strokes is. High cholesterol, by itself, is not always a reason to worry. Having high cholesterol is just one of many things that can increase your risk of heart attacks and strokes. Other factors that increase your risk include:  Cigarette smoking  High blood pressure  Having a parent, sister, or brother who got heart disease at a young age (Young, in this case, means younger than 55 for men and younger than 65 for women.)  Being a man (Women are at risk, too, but men   have a higher risk.)  Older age  If you are at high risk of heart attacks and strokes, having high cholesterol is a problem. On the other hand, if you have are at low risk, having high cholesterol may not mean much. Should I take medicine to lower cholesterol? - Not  everyone who has high cholesterol needs medicines. Your doctor or nurse will decide if you need them based on your age, family history, and other health concerns.  You should probably take a cholesterol-lowering medicine called a statin if you: Already had a heart attack or stroke  Have known heart disease  Have diabetes  Have a condition called peripheral artery disease, which makes it painful to walk, and happens when the arteries in your legs get clogged with fatty deposits  Have an abdominal aortic aneurysm, which is a widening of the main artery in the belly  Most people with any of the conditions listed above should take a statin no matter what their cholesterol level is. If your doctor or nurse puts you on a statin, stay on it. The medicine may not make you feel any different. But it can help prevent heart attacks, strokes, and death.  Can I lower my cholesterol without medicines? - Yes, you can lower your cholesterol some by:  Avoiding red meat, butter, fried foods, cheese, and other foods that have a lot of saturated fat  Losing weight (if you are overweight)  Being more active Even if these steps do little to change your cholesterol, they can improve your health in many ways.                                                   Cholesterol Control Diet  CONTROLLING CHOLESTEROL WITH DIET Although exercise and lifestyle factors are important, your diet is key. That is because certain foods are known to raise cholesterol and others to lower it. The goal is to balance foods for their effect on cholesterol and more importantly, to replace saturated and trans fat with other types of fat, such as monounsaturated fat, polyunsaturated fat, and omega-3 fatty acids. On average, a person should consume no more than 15 to 17 g of saturated fat daily. Saturated and trans fats are considered "bad" fats, and they will raise LDL cholesterol. Saturated fats are primarily found in animal products such as  meats, butter, and cream. However, that does not mean you need to sacrifice all your favorite foods. Today, there are good tasting, low-fat, low-cholesterol substitutes for most of the things you like to eat. Choose low-fat or nonfat alternatives. Choose round or loin cuts of red meat, since these types of cuts are lowest in fat and cholesterol. Chicken (without the skin), fish, veal, and ground turkey breast are excellent choices. Eliminate fatty meats, such as hot dogs and salami. Even shellfish have little or no saturated fat. Have a 3 oz (85 g) portion when you eat lean meat, poultry, or fish. Trans fats are also called "partially hydrogenated oils." They are oils that have been scientifically manipulated so that they are solid at room temperature resulting in a longer shelf life and improved taste and texture of foods in which they are added. Trans fats are found in stick margarine, some tub margarines, cookies, crackers, and baked goods.  When baking and cooking, oils are an excellent substitute   for butter. The monounsaturated oils are especially beneficial since it is believed they lower LDL and raise HDL. The oils you should avoid entirely are saturated tropical oils, such as coconut and palm.  Remember to eat liberally from food groups that are naturally free of saturated and trans fat, including fish, fruit, vegetables, beans, grains (barley, rice, couscous, bulgur wheat), and pasta (without cream sauces).  IDENTIFYING FOODS THAT LOWER CHOLESTEROL  Soluble fiber may lower your cholesterol. This type of fiber is found in fruits such as apples, vegetables such as broccoli, potatoes, and carrots, legumes such as beans, peas, and lentils, and grains such as barley. Foods fortified with plant sterols (phytosterol) may also lower cholesterol. You should eat at least 2 g per day of these foods for a cholesterol lowering effect.  Read package labels to identify low-saturated fats, trans fats free, and  low-fat foods at the supermarket. Select cheeses that have only 2 to 3 g saturated fat per ounce. Use a heart-healthy tub margarine that is free of trans fats or partially hydrogenated oil. When buying baked goods (cookies, crackers), avoid partially hydrogenated oils. Breads and muffins should be made from whole grains (whole-wheat or whole oat flour, instead of "flour" or "enriched flour"). Buy non-creamy canned soups with reduced salt and no added fats.  FOOD PREPARATION TECHNIQUES  Never deep-fry. If you must fry, either stir-fry, which uses very little fat, or use non-stick cooking sprays. When possible, broil, bake, or roast meats, and steam vegetables. Instead of dressing vegetables with butter or margarine, use lemon and herbs, applesauce and cinnamon (for squash and sweet potatoes), nonfat yogurt, salsa, and low-fat dressings for salads.  LOW-SATURATED FAT / LOW-FAT FOOD SUBSTITUTES Meats / Saturated Fat (g)  Avoid: Steak, marbled (3 oz/85 g) / 11 g   Choose: Steak, lean (3 oz/85 g) / 4 g   Avoid: Hamburger (3 oz/85 g) / 7 g   Choose: Hamburger, lean (3 oz/85 g) / 5 g   Avoid: Ham (3 oz/85 g) / 6 g   Choose: Ham, lean cut (3 oz/85 g) / 2.4 g   Avoid: Chicken, with skin, dark meat (3 oz/85 g) / 4 g   Choose: Chicken, skin removed, dark meat (3 oz/85 g) / 2 g   Avoid: Chicken, with skin, light meat (3 oz/85 g) / 2.5 g   Choose: Chicken, skin removed, light meat (3 oz/85 g) / 1 g  Dairy / Saturated Fat (g)  Avoid: Whole milk (1 cup) / 5 g   Choose: Low-fat milk, 2% (1 cup) / 3 g   Choose: Low-fat milk, 1% (1 cup) / 1.5 g   Choose: Skim milk (1 cup) / 0.3 g   Avoid: Hard cheese (1 oz/28 g) / 6 g   Choose: Skim milk cheese (1 oz/28 g) / 2 to 3 g   Avoid: Cottage cheese, 4% fat (1 cup) / 6.5 g   Choose: Low-fat cottage cheese, 1% fat (1 cup) / 1.5 g   Avoid: Ice cream (1 cup) / 9 g   Choose: Sherbet (1 cup) / 2.5 g   Choose: Nonfat frozen yogurt (1 cup) / 0.3 g    Choose: Frozen fruit bar / trace   Avoid: Whipped cream (1 tbs) / 3.5 g   Choose: Nondairy whipped topping (1 tbs) / 1 g  Condiments / Saturated Fat (g)  Avoid: Mayonnaise (1 tbs) / 2 g   Choose: Low-fat mayonnaise (1 tbs) / 1 g   Avoid:   Butter (1 tbs) / 7 g   Choose: Extra light margarine (1 tbs) / 1 g   Avoid: Coconut oil (1 tbs) / 11.8 g   Choose: Olive oil (1 tbs) / 1.8 g   Choose: Corn oil (1 tbs) / 1.7 g   Choose: Safflower oil (1 tbs) / 1.2 g   Choose: Sunflower oil (1 tbs) / 1.4 g   Choose: Soybean oil (1 tbs) / 2.4 g   Choose: Canola oil (1 tbs) / 1 g  Exercise to Lose Weight Exercise and a healthy diet may help you lose weight. Your doctor may suggest specific exercises. EXERCISE IDEAS AND TIPS  Choose low-cost things you enjoy doing, such as walking, bicycling, or exercising to workout videos.   Take stairs instead of the elevator.   Walk during your lunch break.   Park your car further away from work or school.   Go to a gym or an exercise class.   Start with 5 to 10 minutes of exercise each day. Build up to 30 minutes of exercise 4 to 6 days a week.   Wear shoes with good support and comfortable clothes.   Stretch before and after working out.   Work out until you breathe harder and your heart beats faster.   Drink extra water when you exercise.   Do not do so much that you hurt yourself, feel dizzy, or get very short of breath.  Exercises that burn about 150 calories:  Running 1  miles in 15 minutes.   Playing volleyball for 45 to 60 minutes.   Washing and waxing a car for 45 to 60 minutes.   Playing touch football for 45 minutes.   Walking 1  miles in 35 minutes.   Pushing a stroller 1  miles in 30 minutes.   Playing basketball for 30 minutes.   Raking leaves for 30 minutes.   Bicycling 5 miles in 30 minutes.   Walking 2 miles in 30 minutes.   Dancing for 30 minutes.   Shoveling snow for 15 minutes.   Swimming laps  for 20 minutes.   Walking up stairs for 15 minutes.   Bicycling 4 miles in 15 minutes.   Gardening for 30 to 45 minutes.   Jumping rope for 15 minutes.   Washing windows or floors for 45 to 60 minutes.  Document Released: 10/20/2010 Document Revised: 05/30/2011 Document Reviewed: 10/20/2010 [facility] Patient Information 2012 [place], [place].Tetanus, Diphtheria, Pertussis (Tdap) Vaccine What You Need to Know WHY GET VACCINATED? Tetanus, diphtheria and pertussis can be very serious diseases, even for adolescents and adults. Tdap vaccine can protect [place] from these diseases. TETANUS (Lockjaw) causes painful muscle tightening and stiffness, usually all over the body.  It can lead to tightening of muscles in the head and neck so you can't open your mouth, swallow, or sometimes even breathe. Tetanus kills about 1 out of 5 people who are infected. DIPHTHERIA can cause a thick coating to form in the back of the throat.  It can lead to breathing problems, paralysis, heart failure, and death. PERTUSSIS (Whooping Cough) causes severe coughing spells, which can cause difficulty breathing, vomiting and disturbed sleep.  It can also lead to weight loss, incontinence, and rib fractures. Up to 2 in 100 adolescents and 5 in 100 adults with pertussis are hospitalized or have complications, which could include pneumonia and death. These diseases are caused by bacteria. Diphtheria and pertussis are spread from person to person through coughing or sneezing. Tetanus  enters the body through cuts, scratches, or wounds. Before vaccines, the [place] saw as many as 200,000 cases a year of diphtheria and pertussis, and hundreds of cases of tetanus. Since vaccination began, tetanus and diphtheria have dropped by about 99% and pertussis by about 80%. TDAP VACCINE Tdap vaccine can protect adolescents and adults from tetanus, diphtheria, and pertussis. One dose of Tdap is routinely given at age 61 or 65. People who  did not get Tdap at that age should get it as soon as possible. Tdap is especially important for health care professionals and anyone having close contact with a baby younger than 12 months. Pregnant women should get a dose of Tdap during every pregnancy, to protect the newborn from pertussis. Infants are most at risk for severe, life-threatening complications from pertussis. A similar vaccine, called Td, protects from tetanus and diphtheria, but not pertussis. A Td booster should be given every 10 years. Tdap may be given as one of these boosters if you have not already gotten a dose. Tdap may also be given after a severe cut or burn to prevent tetanus infection. Your doctor can give you more information. Tdap may safely be given at the same time as other vaccines. SOME PEOPLE SHOULD NOT GET THIS VACCINE  If you ever had a life-threatening allergic reaction after a dose of any tetanus, diphtheria, or pertussis containing vaccine, OR if you have a severe allergy to any part of this vaccine, you should not get Tdap. Tell your doctor if you have any severe allergies.  If you had a coma, or long or multiple seizures within 7 days after a childhood dose of DTP or DTaP, you should not get Tdap, unless a cause other than the vaccine was found. You can still get Td.  Talk to your doctor if you:  have epilepsy or another nervous system problem,  had severe pain or swelling after any vaccine containing diphtheria, tetanus or pertussis,  ever had Guillain-Barr Syndrome (GBS),  aren't feeling well on the day the shot is scheduled. RISKS OF A VACCINE REACTION With any medicine, including vaccines, there is a chance of side effects. These are usually mild and go away on their own, but serious reactions are also possible. Brief fainting spells can follow a vaccination, leading to injuries from falling. Sitting or lying down for about 15 minutes can help prevent these. Tell your doctor if you feel dizzy or  light-headed, or have vision changes or ringing in the ears. Mild problems following Tdap (Did not interfere with activities)  Pain where the shot was given (about 3 in 4 adolescents or 2 in 3 adults)  Redness or swelling where the shot was given (about 1 person in 5)  Mild fever of at least 100.66F (up to about 1 in 25 adolescents or 1 in 100 adults)  Headache (about 3 or 4 people in 10)  Tiredness (about 1 person in 3 or 4)  Nausea, vomiting, diarrhea, stomach ache (up to 1 in 4 adolescents or 1 in 10 adults)  Chills, body aches, sore joints, rash, swollen glands (uncommon) Moderate problems following Tdap (Interfered with activities, but did not require medical attention)  Pain where the shot was given (about 1 in 5 adolescents or 1 in 100 adults)  Redness or swelling where the shot was given (up to about 1 in 16 adolescents or 1 in 25 adults)  Fever over 102F (about 1 in 100 adolescents or 1 in 250 adults)  Headache (about  3 in 20 adolescents or 1 in 10 adults)  Nausea, vomiting, diarrhea, stomach ache (up to 1 or 3 people in 100)  Swelling of the entire arm where the shot was given (up to about 3 in 100). Severe problems following Tdap (Unable to perform usual activities, required medical attention)  Swelling, severe pain, bleeding and redness in the arm where the shot was given (rare). A severe allergic reaction could occur after any vaccine (estimated less than 1 in a million doses). WHAT IF THERE IS A SERIOUS REACTION? What should I look for?  Look for anything that concerns you, such as signs of a severe allergic reaction, very high fever, or behavior changes. Signs of a severe allergic reaction can include hives, swelling of the face and throat, difficulty breathing, a fast heartbeat, dizziness, and weakness. These would start a few minutes to a few hours after the vaccination. What should I do?  If you think it is a severe allergic reaction or other emergency that  can't wait, call 9-1-1 or get the person to the nearest hospital. Otherwise, call your doctor.  Afterward, the reaction should be reported to the "Vaccine Adverse Event Reporting System" (VAERS). Your doctor might file this report, or you can do it yourself through the VAERS web site at www.vaers.LAgents.no, or by calling 1-516-376-8163. VAERS is only for reporting reactions. They do not give medical advice.  THE NATIONAL VACCINE INJURY COMPENSATION PROGRAM The National Vaccine Injury Compensation Program (VICP) is a federal program that was created to compensate people who may have been injured by certain vaccines. Persons who believe they may have been injured by a vaccine can learn about the program and about filing a claim by calling 1-(331)051-2744 or visiting the VICP website at SpiritualWord.at. HOW CAN I LEARN MORE?  Ask your doctor.  Call your local or state health department.  Contact the Centers for Disease Control and Prevention (CDC):  Call 5126262543 or visit CDC's website at PicCapture.uy. CDC Tdap Vaccine VIS (02/07/12) Document Released: 03/18/2012 Document Revised: 06/11/2012 Document Reviewed: 03/18/2012 [employer] Patient Information 2014 [place], [place].

## 2013-03-12 NOTE — Progress Notes (Addendum)
Sophia Martinez 1966-09-16 [id number]   History:    47 y.o.  for annual gyn exam is gravida and was having no complaints today. She was seen last year by my partner who retired and was evaluated for been menopausal and was confirmed by laboratory [facility] and was started on Estrace 1-1/2 tablet daily along with the addition of Provera 10 days a month. Patient many years ago had history of endometriosis was laparoscoped twice and eventually had a right salpingo-oophorectomy. Patient denies any prior history of abnormal Pap smears. Patient has not received the Tdap vaccine yet. She does her monthly breast exams.  Past medical history,surgical history, family history and social history were all reviewed and documented in the EPIC chart.  Gynecologic History No LMP recorded. Patient is not currently having periods (Reason: Perimenopausal). Contraception: post menopausal status Last Pap: 2013. Results were: normal Last mammogram: 2013. Results were: normal  Obstetric History OB History   Grav Para Term Preterm Abortions TAB SAB Ect Mult Living   0                ROS: A ROS was performed and pertinent positives and negatives are included in the history.  GENERAL: No fevers or chills. HEENT: No change in vision, no earache, sore throat or sinus congestion. NECK: No pain or stiffness. CARDIOVASCULAR: No chest pain or pressure. No palpitations. PULMONARY: No shortness of breath, cough or wheeze. GASTROINTESTINAL: No abdominal pain, nausea, vomiting or diarrhea, melena or bright red blood per rectum. GENITOURINARY: No urinary frequency, urgency, hesitancy or dysuria. MUSCULOSKELETAL: No joint or muscle pain, no back pain, no recent trauma. DERMATOLOGIC: No rash, no itching, no lesions. ENDOCRINE: No polyuria, polydipsia, no heat or cold intolerance. No recent change in weight. HEMATOLOGICAL: No anemia or easy bruising or bleeding. NEUROLOGIC: No headache, seizures, numbness, tingling or weakness. PSYCHIATRIC:  No depression, no loss of interest in normal activity or change in sleep pattern.     Exam: chaperone present  BP 120/80  Ht 5\' 3"  (1.6 m)  Wt 149 lb (67.586 kg)  BMI 26.4 kg/m2  Body mass index is 26.4 kg/(m^2).  General appearance : Well developed well nourished female. No acute distress HEENT: Neck supple, trachea midline, no carotid bruits, no thyroidmegaly Lungs: Clear to auscultation, no rhonchi or wheezes, or rib retractions  Heart: Regular rate and rhythm, no murmurs or gallops Breast:Examined in sitting and supine position were symmetrical in appearance, no palpable masses or tenderness,  no skin retraction, no nipple inversion, no nipple discharge, no skin discoloration, no axillary or supraclavicular lymphadenopathy Abdomen: no palpable masses or tenderness, no rebound or guarding Extremities: no edema or skin discoloration or tenderness  Pelvic:  Bartholin, Urethra, Skene Glands: Within normal limits             Vagina: No gross lesions or discharge  Cervix: No gross lesions or discharge  Uterus  anteverted, normal size, shape and consistency, non-tender and mobile  Adnexa  Without masses or tenderness  Anus and perineum  normal   Rectovaginal  normal sphincter tone without palpated masses or tenderness             Hemoccult that indicated     Assessment/Plan:  47 y.o. female for annual exam who was given prescription refill for Estrace which she takes 1-1/2 tablet daily along with the addition of Provera 10 mg for 10 days a month. We went through a detail discussion of the women's health initiative study and potential risk of  breast cancer with patients who are normal replacement therapy for a long time. She was reminded her monthly self breast examination and to schedule her mammogram later this year. We discussed importance of calcium vitamin D and regular exercise for osteoporosis prevention. She has had a past history vitamin D deficiency and for this reason we'll  check her vitamin D level today. We'll check a CBC, screen cholesterol, hemoglobin A1c, TSH and urinalysis as well.no Pap smear was done today the new guidelines were discussed. We will plan on doing bone density study next year. Patient will receive a Tdap vaccine today.    Ok Edwards MD, 2:37 PM 03/12/2013

## 2013-03-13 LAB — URINALYSIS W MICROSCOPIC + REFLEX CULTURE
Bilirubin Urine: NEGATIVE
Casts: NONE SEEN
Glucose, UA: NEGATIVE mg/dL
Hgb urine dipstick: NEGATIVE
Protein, ur: NEGATIVE mg/dL
Squamous Epithelial / LPF: NONE SEEN
pH: 7 (ref 5.0–8.0)

## 2013-03-13 LAB — TSH: TSH: 1.732 u[IU]/mL (ref 0.350–4.500)

## 2013-03-14 LAB — URINE CULTURE: Colony Count: 45000

## 2014-07-01 ENCOUNTER — Other Ambulatory Visit: Payer: Self-pay | Admitting: Gynecology

## 2015-10-27 ENCOUNTER — Encounter: Payer: Self-pay | Admitting: Gynecology

## 2017-02-13 ENCOUNTER — Encounter: Payer: Self-pay | Admitting: Gynecology

## 2018-03-31 DIAGNOSIS — Z1231 Encounter for screening mammogram for malignant neoplasm of breast: Secondary | ICD-10-CM | POA: Diagnosis not present

## 2018-04-07 DIAGNOSIS — N951 Menopausal and female climacteric states: Secondary | ICD-10-CM | POA: Diagnosis not present

## 2018-04-07 DIAGNOSIS — R928 Other abnormal and inconclusive findings on diagnostic imaging of breast: Secondary | ICD-10-CM | POA: Diagnosis not present

## 2018-04-14 DIAGNOSIS — N6489 Other specified disorders of breast: Secondary | ICD-10-CM | POA: Diagnosis not present

## 2018-04-14 DIAGNOSIS — N641 Fat necrosis of breast: Secondary | ICD-10-CM | POA: Diagnosis not present

## 2018-04-14 DIAGNOSIS — R928 Other abnormal and inconclusive findings on diagnostic imaging of breast: Secondary | ICD-10-CM | POA: Diagnosis not present

## 2018-05-05 DIAGNOSIS — N951 Menopausal and female climacteric states: Secondary | ICD-10-CM | POA: Diagnosis not present

## 2018-05-07 DIAGNOSIS — S5332XA Traumatic rupture of left ulnar collateral ligament, initial encounter: Secondary | ICD-10-CM | POA: Diagnosis not present

## 2018-08-14 DIAGNOSIS — J205 Acute bronchitis due to respiratory syncytial virus: Secondary | ICD-10-CM | POA: Diagnosis not present

## 2019-03-24 DIAGNOSIS — L550 Sunburn of first degree: Secondary | ICD-10-CM | POA: Diagnosis not present

## 2019-11-23 DIAGNOSIS — U071 COVID-19: Secondary | ICD-10-CM | POA: Diagnosis not present

## 2019-12-02 DIAGNOSIS — J029 Acute pharyngitis, unspecified: Secondary | ICD-10-CM | POA: Diagnosis not present

## 2019-12-02 DIAGNOSIS — M546 Pain in thoracic spine: Secondary | ICD-10-CM | POA: Diagnosis not present

## 2019-12-02 DIAGNOSIS — R05 Cough: Secondary | ICD-10-CM | POA: Diagnosis not present

## 2019-12-03 ENCOUNTER — Emergency Department (HOSPITAL_COMMUNITY): Payer: BC Managed Care – PPO

## 2019-12-03 ENCOUNTER — Encounter (HOSPITAL_COMMUNITY): Payer: Self-pay | Admitting: Pharmacy Technician

## 2019-12-03 ENCOUNTER — Emergency Department (HOSPITAL_COMMUNITY)
Admission: EM | Admit: 2019-12-03 | Discharge: 2019-12-04 | Disposition: A | Discharge status: Home / Self Care | Payer: BC Managed Care – PPO | Attending: Emergency Medicine | Admitting: Emergency Medicine

## 2019-12-03 DIAGNOSIS — Z79899 Other long term (current) drug therapy: Secondary | ICD-10-CM | POA: Insufficient documentation

## 2019-12-03 DIAGNOSIS — G5793 Unspecified mononeuropathy of bilateral lower limbs: Secondary | ICD-10-CM

## 2019-12-03 DIAGNOSIS — R29818 Other symptoms and signs involving the nervous system: Secondary | ICD-10-CM | POA: Diagnosis not present

## 2019-12-03 DIAGNOSIS — I10 Essential (primary) hypertension: Secondary | ICD-10-CM | POA: Diagnosis not present

## 2019-12-03 DIAGNOSIS — R200 Anesthesia of skin: Secondary | ICD-10-CM | POA: Diagnosis not present

## 2019-12-03 DIAGNOSIS — M47816 Spondylosis without myelopathy or radiculopathy, lumbar region: Secondary | ICD-10-CM

## 2019-12-03 DIAGNOSIS — M79605 Pain in left leg: Secondary | ICD-10-CM | POA: Insufficient documentation

## 2019-12-03 DIAGNOSIS — Z8616 Personal history of COVID-19: Secondary | ICD-10-CM | POA: Insufficient documentation

## 2019-12-03 DIAGNOSIS — M79604 Pain in right leg: Secondary | ICD-10-CM | POA: Diagnosis not present

## 2019-12-03 LAB — CBC WITH DIFFERENTIAL/PLATELET
Abs Immature Granulocytes: 0.03 10*3/uL (ref 0.00–0.07)
Basophils Absolute: 0 10*3/uL (ref 0.0–0.1)
Basophils Relative: 1 %
Eosinophils Absolute: 0.1 10*3/uL (ref 0.0–0.5)
Eosinophils Relative: 2 %
HCT: 41.1 % (ref 36.0–46.0)
Hemoglobin: 13.5 g/dL (ref 12.0–15.0)
Immature Granulocytes: 1 %
Lymphocytes Relative: 36 %
Lymphs Abs: 1.8 10*3/uL (ref 0.7–4.0)
MCH: 31.2 pg (ref 26.0–34.0)
MCHC: 32.8 g/dL (ref 30.0–36.0)
MCV: 94.9 fL (ref 80.0–100.0)
Monocytes Absolute: 0.5 10*3/uL (ref 0.1–1.0)
Monocytes Relative: 11 %
Neutro Abs: 2.5 10*3/uL (ref 1.7–7.7)
Neutrophils Relative %: 49 %
Platelets: 425 10*3/uL — ABNORMAL HIGH (ref 150–400)
RBC: 4.33 MIL/uL (ref 3.87–5.11)
RDW: 11.4 % — ABNORMAL LOW (ref 11.5–15.5)
WBC: 5 10*3/uL (ref 4.0–10.5)
nRBC: 0 % (ref 0.0–0.2)

## 2019-12-03 LAB — COMPREHENSIVE METABOLIC PANEL
ALT: 27 U/L (ref 0–44)
AST: 22 U/L (ref 15–41)
Albumin: 3.9 g/dL (ref 3.5–5.0)
Alkaline Phosphatase: 125 U/L (ref 38–126)
Anion gap: 9 (ref 5–15)
BUN: 9 mg/dL (ref 6–20)
CO2: 24 mmol/L (ref 22–32)
Calcium: 9 mg/dL (ref 8.9–10.3)
Chloride: 106 mmol/L (ref 98–111)
Creatinine, Ser: 0.73 mg/dL (ref 0.44–1.00)
GFR calc Af Amer: >60 mL/min (ref >60)
GFR calc non Af Amer: >60 mL/min (ref >60)
Glucose, Bld: 96 mg/dL (ref 70–99)
Potassium: 3.9 mmol/L (ref 3.5–5.1)
Sodium: 139 mmol/L (ref 135–145)
Total Bilirubin: 0.2 mg/dL — ABNORMAL LOW (ref 0.3–1.2)
Total Protein: 7.3 g/dL (ref 6.5–8.1)

## 2019-12-03 LAB — URINALYSIS, ROUTINE W REFLEX MICROSCOPIC
Bacteria, UA: NONE SEEN
Bilirubin Urine: NEGATIVE
Glucose, UA: NEGATIVE mg/dL
Ketones, ur: NEGATIVE mg/dL
Leukocytes,Ua: NEGATIVE
Nitrite: NEGATIVE
Protein, ur: NEGATIVE mg/dL
Specific Gravity, Urine: 1.008 (ref 1.005–1.030)
pH: 5 (ref 5.0–8.0)

## 2019-12-03 LAB — I-STAT BETA HCG BLOOD, ED (MC, WL, AP ONLY): I-stat hCG, quantitative: <5 m[IU]/mL (ref <5)

## 2019-12-03 MED ORDER — MORPHINE SULFATE (PF) 4 MG/ML IV SOLN
4.0000 mg | Freq: Once | INTRAVENOUS | Status: AC
  Administered 2019-12-03: 4 mg via INTRAVENOUS
  Filled 2019-12-03: qty 1

## 2019-12-03 NOTE — ED Notes (Signed)
Transported to [facility] 

## 2019-12-03 NOTE — ED Triage Notes (Signed)
Pt arrives pov with reports of bil leg pain and numbness. States the pain started yesterday as muscle spasms and has progressively worsened. Tested + for covid on 2/22. Seen at [facility] yesterday and was told to come here to rule out pinched nerve. Pt ambulatory in triage.

## 2019-12-03 NOTE — ED Provider Notes (Signed)
[facility] EMERGENCY DEPARTMENT Provider Note   CSN: [id number] Arrival date & time: 12/03/19  1220     History Chief Complaint  Patient presents with  . Leg Pain    Sophia Martinez is a 54 y.o. female.  HPI   54 year old female, diagnosed with COVID-19 on 11/23/19, presents with bilateral leg pain.  Patient states that for the last 5 days she has had pain in her bilateral legs.  She states symptoms started with a muscle spasm of the right medial distal thigh which then resolved.  She then had pain of bilateral thighs radiating to bilateral feet.  She states that the pain resolved and then she started having numbness, tingling and a burning sensation on the top of her feet.  She states this sensation has worsened to bilateral knees.  She notes that the right leg hurts worse and feels more numb than the left leg.  She notes pain is better with walking around and worse with sitting.  She denies any other aggravating or alleviating factors.  She denies any recent back injury or trauma, back pain.  She notes some residual shortness of breath since her COVID-19 diagnosis.  She denies any chest pain, nausea, vomiting, diarrhea, abdominal pain.  She denies any bowel or bladder incontinence, saddle anesthesias.           Past Medical History:  Diagnosis Date  . Ovarian cyst     Patient Active Problem List   Diagnosis Date Noted  . Weight gain 03/12/2013  . Exertional dyspnea 01/24/2012  . Hemoptysis, unspecified 01/24/2012  . Ovarian cyst     Past Surgical History:  Procedure Laterality Date  . ABDOMINAL SURGERY  09-03-2000   Exploratory laparotomy -lysis bowel adhesions-Right salpingophorectomy  . APPENDECTOMY  1972  . EXPLORATORY LAPAROTOMY  2001   with lysis of adhesions  . OOPHORECTOMY  12-4 2001   RSO  . PELVIC LAPAROSCOPY  04-25-2000   Lysis of pelvic adhesions-Left salpingostomy     OB History    Gravida  0   Para      Term      Preterm     AB      Living        SAB      TAB      Ectopic      Multiple      Live Births              Family History  Problem Relation Age of Onset  . Cancer Maternal Grandmother        cervical  . Diabetes Mother   . Cancer Sister        uterine  . Hypertension Brother   . Cancer Sister        uterine    Social History   Tobacco Use  . Smoking status: Never Smoker  Substance Use Topics  . Alcohol use: No  . Drug use: No    Home Medications Prior to Admission medications   Medication Sig Start Date End Date Taking? Authorizing Provider  estradiol (ESTRACE) 1 MG tablet Take 1 and a half tablet daily 03/12/13   Ok Edwards, MD  medroxyPROGESTERone (PROVERA) 10 MG tablet One daily first 12 days of month. 03/12/13   Ok Edwards, MD    Allergies    Amoxicillin, Ampicillin, Codeine, Penicillins, Shellfish-derived products, and Vicodin [hydrocodone-acetaminophen]  Review of Systems   Review of Systems  Constitutional: Negative for chills and  fever.  HENT: Negative for rhinorrhea and sore throat.   Eyes: Negative for visual disturbance.  Respiratory: Positive for shortness of breath. Negative for cough.   Cardiovascular: Negative for chest pain and leg swelling.  Gastrointestinal: Negative for abdominal pain, diarrhea, nausea and vomiting.  Genitourinary: Negative for dysuria, frequency and urgency.  Musculoskeletal: Positive for arthralgias. Negative for gait problem, joint swelling and neck pain.  Skin: Negative for rash and wound.  Neurological: Positive for numbness. Negative for syncope.  All other systems reviewed and are negative.   Physical Exam Updated Vital Signs BP (!) 147/79 (BP Location: Right Arm) Comment: Simultaneous filing. User may not have seen previous data.  Pulse 66 Comment: Simultaneous filing. User may not have seen previous data.  Temp 98.1 F (36.7 C) (Oral) Comment: Simultaneous filing. User may not have seen previous data.  Comment (Src): Simultaneous filing. User may not have seen previous data.  Resp 16 Comment: Simultaneous filing. User may not have seen previous data.  SpO2 100% Comment: Simultaneous filing. User may not have seen previous data.  Physical Exam Vitals and nursing note reviewed.  Constitutional:      Appearance: She is well-developed.  HENT:     Head: Normocephalic and atraumatic.  Eyes:     General: No visual field deficit.    Conjunctiva/sclera: Conjunctivae normal.  Cardiovascular:     Rate and Rhythm: Normal rate and regular rhythm.     Heart sounds: Normal heart sounds. No murmur.  Pulmonary:     Effort: Pulmonary effort is normal. No respiratory distress.     Breath sounds: Normal breath sounds. No wheezing or rales.  Abdominal:     General: Bowel sounds are normal. There is no distension.     Palpations: Abdomen is soft.     Tenderness: There is no abdominal tenderness.  Musculoskeletal:        General: No tenderness or deformity. Normal range of motion.     Cervical back: Neck supple.  Skin:    General: Skin is warm and dry.     Findings: No erythema or rash.  Neurological:     Mental Status: She is alert and oriented to person, place, and time.     GCS: GCS eye subscore is 4. GCS verbal subscore is 5. GCS motor subscore is 6.     Cranial Nerves: Cranial nerves are intact. No dysarthria or facial asymmetry.     Sensory: Sensory deficit (right leg) present.     Motor: Weakness (right leg) present.     Coordination: Coordination is intact.     Gait: Gait is intact.     Deep Tendon Reflexes:     Reflex Scores:      Patellar reflexes are 2+ on the right side and 2+ on the left side.      Achilles reflexes are 2+ on the right side and 2+ on the left side.    Comments: Decrease sensation of the right lower extremity as compared to the left.  Weakness of the right lower extremity, strength 3 out of 5 compared to 5 out of 5 on the left  Psychiatric:        Behavior: Behavior  normal.     ED Results / Procedures / Treatments   Labs (all labs ordered are listed, but only abnormal results are displayed) Labs Reviewed  CBC WITH DIFFERENTIAL/PLATELET  COMPREHENSIVE METABOLIC PANEL  URINALYSIS, ROUTINE W REFLEX MICROSCOPIC  I-STAT BETA HCG BLOOD, ED ([facility], [facility], [facility] ONLY)  EKG None  Radiology No results found.  Procedures Procedures (including critical care time)  Medications Ordered in ED Medications - No data to display  ED Course  I have reviewed the triage vital signs and the nursing notes.  Pertinent labs & imaging results that were available during my care of the patient were reviewed by me and considered in my medical decision making (see chart for details).    MDM Rules/Calculators/A&P                       1700: Discussed case with neurology on-call, Dr. Malen Gauze.  Since DTRs are equal in bilateral lower extremities he recommends MRI brain and lumbar spine without contrast.  If it any abnormalities on MRI patient will need admission.  If MRIs are normal patient can follow-up outpatient with PT and OT.    Patient presented with bilateral leg numbness, tingling and burning sensation.  However when doing physical exam she did endorse decreased sensation on the right lower extremity and had weakness of the right lower extremity.  No other focal deficits noted.  Normal DTRs.  She had blood work which showed slightly elevated platelet count, otherwise unremarkable CBC.  CMP unremarkable.  Urine not consistent with a UTI.  She had a CT head which showed no acute abnormalities.  Called and discussed case with neurology on-call who recommended further evaluation with MRI of the head and lumbar spine.  If MRIs are unremarkable patient may follow-up outpatient with PT and OT.  I would recommend discharge with gabapentin for possible nerve pain.  Signout given to incoming PA Antonietta Breach who will follow up on MRI and determine disposition.     Final Clinical  Impression(s) / ED Diagnoses Final diagnoses:  None    Rx / DC Orders ED Discharge Orders    None       Rachel Moulds 12/03/19 2215    Davonna Belling, MD 12/04/19 [phone]

## 2019-12-03 NOTE — ED Notes (Signed)
Back from CT

## 2019-12-04 DIAGNOSIS — M545 Low back pain: Secondary | ICD-10-CM | POA: Diagnosis not present

## 2019-12-04 DIAGNOSIS — R29818 Other symptoms and signs involving the nervous system: Secondary | ICD-10-CM | POA: Diagnosis not present

## 2019-12-04 MED ORDER — GABAPENTIN 300 MG PO CAPS: 300.0000 mg | ORAL_CAPSULE | Freq: Three times a day (TID) | ORAL | 1 refills | Status: AC

## 2019-12-04 NOTE — Discharge Instructions (Signed)
We recommend gabapentin as prescribed for management of your ongoing pain.  You may take this with Tylenol or ibuprofen for management of persistent symptoms.  You would benefit from follow-up with neurosurgery as well as a primary care doctor to ensure resolution of your nerve pain.  Return to the ED for any new or concerning symptoms.

## 2019-12-04 NOTE — ED Provider Notes (Signed)
1:45 AM Patient care assumed from [place], [place] at shift change.  In short, patient is a 54 year old female presenting for bilateral leg pain.  She describes the pain as a burning sensation as well as a numbness.  Reported to have subjective decreased sensation on the right compared to left.  Also documented to have some strength deficits in the RLE compared to left; unclear if this was pain mediated.  Gait intact.  Neurology was consulted who recommended MRI brain as well as MR lumbar spine.  These findings have been reviewed.  There is no evidence of intracranial abnormality.  No CVA, hemorrhage, hydrocephalus, mass/lesion.  Lumbar spine does reveal some bilateral facet hypertrophy at L5/S1.  There is also degenerative disc disease from L2-L4.  No central cord impingement.  No indication for further emergent work-up, though I have reviewed these findings with the patient and have recommended outpatient follow-up with neurosurgery.  Referral provided.  She states that she is feeling much more comfortable since receiving a dose of morphine.  She expresses comfort with plan for continued outpatient assessment.  Will trial on Gabapentin given lack of improvement PTA with NSAIDs.  Return precautions discussed and provided. Patient discharged in stable condition with no unaddressed concerns.   CT Head Wo Contrast  Result Date: 12/03/2019 CLINICAL DATA:  Focal neurologic deficit greater than 6 hours. Suspect stroke. Reports bilateral leg pain and numbness. EXAM: CT HEAD WITHOUT CONTRAST TECHNIQUE: Contiguous axial images were obtained from the base of the skull through the vertex without intravenous contrast. COMPARISON:  None. FINDINGS: Brain: No evidence of acute infarction, hemorrhage, hydrocephalus, extra-axial collection or mass lesion/mass effect. Vascular: No hyperdense vessel or unexpected calcification. Skull: Normal. Negative for fracture or focal lesion. Sinuses/Orbits: No acute finding. Other: None.  IMPRESSION: Normal brain.  No acute intracranial abnormality. Electronically Signed   By: Kerby Moors M.D.   On: 12/03/2019 18:22   MR Brain Wo Contrast (neuro protocol)  Result Date: 12/04/2019 CLINICAL DATA:  Initial evaluation for acute bilateral leg pain, progressive neurologic deficit. EXAM: MRI HEAD WITHOUT CONTRAST TECHNIQUE: Multiplanar, multiecho pulse sequences of the brain and surrounding structures were obtained without intravenous contrast. COMPARISON:  Prior head CT from 12/03/2019. FINDINGS: Brain: Cerebral volume within normal limits for patient age. Few scattered subcentimeter foci of T2/FLAIR hyperintensity noted involving the deep and subcortical white matter both cerebral hemispheres, nonspecific, but felt to be within normal limits for age. No abnormal foci of restricted diffusion to suggest acute or subacute ischemia. Gray-white matter differentiation well maintained. No encephalomalacia to suggest chronic infarction. No foci of susceptibility artifact to suggest acute or chronic intracranial hemorrhage. No mass lesion, midline shift or mass effect. No hydrocephalus. No extra-axial fluid collection. Major dural sinuses are grossly patent. Pituitary gland and suprasellar region are normal. Midline structures intact and normal. Vascular: Major intracranial vascular flow voids well maintained and normal in appearance. Skull and upper cervical spine: Craniocervical junction normal. Visualized upper cervical spine within normal limits. Bone marrow signal intensity normal. No scalp soft tissue abnormality. Sinuses/Orbits: Globes and orbital soft tissues within normal limits. Paranasal sinuses are clear. No mastoid effusion. Inner ear structures normal. Other: None. IMPRESSION: Normal brain MRI for age. No acute intracranial abnormality identified. Electronically Signed   By: Jeannine Boga M.D.   On: 12/04/2019 00:50   MR Lumbar Spine Wo Contrast  Result Date: 12/04/2019 CLINICAL DATA:   Initial evaluation for low back pain with bilateral leg pain, progressive neurologic deficit. EXAM: MRI LUMBAR SPINE WITHOUT  CONTRAST TECHNIQUE: Multiplanar, multisequence MR imaging of the lumbar spine was performed. No intravenous contrast was administered. COMPARISON:  None available. FINDINGS: Segmentation: Standard. Lowest well-formed disc space labeled the L5-S1 level. Alignment: Physiologic with preservation of the normal lumbar lordosis. No listhesis. Vertebrae: Vertebral body height maintained without evidence for acute or chronic fracture. Bone marrow signal intensity within normal limits. No discrete or worrisome osseous lesions. Mild reactive edema about the right L5-S1 facet due to facet arthritis. No other abnormal marrow edema. Conus medullaris and cauda equina: Conus extends to the L1 level. Conus and cauda equina appear normal. Paraspinal and other soft tissues: Unremarkable. Disc levels: L1-2:  Unremarkable. L2-3: Mild diffuse disc bulge with disc desiccation and intervertebral disc space narrowing. Disc bulging slightly eccentric to the left. Minimal facet hypertrophy. No significant canal or foraminal stenosis. No impingement. L3-4: Disc desiccation with mild annular disc bulge. No significant canal or foraminal stenosis. L4-5: Negative interspace. Mild facet hypertrophy. No stenosis or impingement. L5-S1: Negative interspace. Moderate right worse than left facet hypertrophy. Associated reactive marrow edema and trace joint effusion on the right. No canal or lateral recess stenosis. Foramina remain patent. IMPRESSION: 1. Moderate bilateral facet hypertrophy at L5-S1, worse on the right where there is associated reactive marrow edema. Finding could contribute to lower back pain. 2. Minor for age degenerative disc disease at L2-3 and L3-4 without significant stenosis or impingement. Electronically Signed   By: Rise Mu M.D.   On: 12/04/2019 00:40      Antony Madura, PA-C 12/04/19  0148    Glynn Octave, MD 12/04/19 [phone]

## 2019-12-04 NOTE — ED Notes (Signed)
Patient verbalizes understanding of discharge instructions. Opportunity for questioning and answers were provided. Armband removed by staff, pt discharged from ED ambulatory.   

## 2019-12-08 DIAGNOSIS — M47817 Spondylosis without myelopathy or radiculopathy, lumbosacral region: Secondary | ICD-10-CM | POA: Diagnosis not present

## 2019-12-12 DIAGNOSIS — H6123 Impacted cerumen, bilateral: Secondary | ICD-10-CM | POA: Diagnosis not present

## 2020-02-22 DIAGNOSIS — G5603 Carpal tunnel syndrome, bilateral upper limbs: Secondary | ICD-10-CM | POA: Diagnosis not present

## 2020-02-22 DIAGNOSIS — M5430 Sciatica, unspecified side: Secondary | ICD-10-CM | POA: Diagnosis not present

## 2020-03-22 DIAGNOSIS — Z0000 Encounter for general adult medical examination without abnormal findings: Secondary | ICD-10-CM | POA: Diagnosis not present

## 2020-03-22 DIAGNOSIS — Z6830 Body mass index (BMI) 30.0-30.9, adult: Secondary | ICD-10-CM | POA: Diagnosis not present

## 2020-03-22 DIAGNOSIS — E669 Obesity, unspecified: Secondary | ICD-10-CM | POA: Diagnosis not present

## 2020-04-06 DIAGNOSIS — Z1211 Encounter for screening for malignant neoplasm of colon: Secondary | ICD-10-CM | POA: Diagnosis not present

## 2020-04-06 DIAGNOSIS — Z1212 Encounter for screening for malignant neoplasm of rectum: Secondary | ICD-10-CM | POA: Diagnosis not present

## 2020-05-10 DIAGNOSIS — E669 Obesity, unspecified: Secondary | ICD-10-CM | POA: Diagnosis not present

## 2020-05-10 DIAGNOSIS — Z6830 Body mass index (BMI) 30.0-30.9, adult: Secondary | ICD-10-CM | POA: Diagnosis not present

## 2020-05-10 DIAGNOSIS — M542 Cervicalgia: Secondary | ICD-10-CM | POA: Diagnosis not present

## 2020-05-11 DIAGNOSIS — M542 Cervicalgia: Secondary | ICD-10-CM | POA: Diagnosis not present

## 2020-05-11 DIAGNOSIS — M4003 Postural kyphosis, cervicothoracic region: Secondary | ICD-10-CM | POA: Diagnosis not present

## 2020-05-16 DIAGNOSIS — M4003 Postural kyphosis, cervicothoracic region: Secondary | ICD-10-CM | POA: Diagnosis not present

## 2020-05-16 DIAGNOSIS — M542 Cervicalgia: Secondary | ICD-10-CM | POA: Diagnosis not present

## 2020-05-18 DIAGNOSIS — F5101 Primary insomnia: Secondary | ICD-10-CM | POA: Diagnosis not present

## 2020-05-18 DIAGNOSIS — Z6829 Body mass index (BMI) 29.0-29.9, adult: Secondary | ICD-10-CM | POA: Diagnosis not present

## 2020-05-24 DIAGNOSIS — Z1231 Encounter for screening mammogram for malignant neoplasm of breast: Secondary | ICD-10-CM | POA: Diagnosis not present

## 2020-06-24 DIAGNOSIS — M4003 Postural kyphosis, cervicothoracic region: Secondary | ICD-10-CM | POA: Diagnosis not present

## 2020-06-24 DIAGNOSIS — M542 Cervicalgia: Secondary | ICD-10-CM | POA: Diagnosis not present

## 2020-06-27 DIAGNOSIS — M542 Cervicalgia: Secondary | ICD-10-CM | POA: Diagnosis not present

## 2020-06-27 DIAGNOSIS — M4003 Postural kyphosis, cervicothoracic region: Secondary | ICD-10-CM | POA: Diagnosis not present

## 2020-06-30 DIAGNOSIS — M542 Cervicalgia: Secondary | ICD-10-CM | POA: Diagnosis not present

## 2020-06-30 DIAGNOSIS — M4003 Postural kyphosis, cervicothoracic region: Secondary | ICD-10-CM | POA: Diagnosis not present

## 2020-07-05 DIAGNOSIS — M4003 Postural kyphosis, cervicothoracic region: Secondary | ICD-10-CM | POA: Diagnosis not present

## 2020-07-05 DIAGNOSIS — M542 Cervicalgia: Secondary | ICD-10-CM | POA: Diagnosis not present

## 2020-07-07 DIAGNOSIS — M4003 Postural kyphosis, cervicothoracic region: Secondary | ICD-10-CM | POA: Diagnosis not present

## 2020-07-07 DIAGNOSIS — M542 Cervicalgia: Secondary | ICD-10-CM | POA: Diagnosis not present

## 2020-07-11 DIAGNOSIS — M4003 Postural kyphosis, cervicothoracic region: Secondary | ICD-10-CM | POA: Diagnosis not present

## 2020-07-11 DIAGNOSIS — M542 Cervicalgia: Secondary | ICD-10-CM | POA: Diagnosis not present

## 2020-07-14 DIAGNOSIS — M4003 Postural kyphosis, cervicothoracic region: Secondary | ICD-10-CM | POA: Diagnosis not present

## 2020-07-14 DIAGNOSIS — M542 Cervicalgia: Secondary | ICD-10-CM | POA: Diagnosis not present

## 2020-07-18 DIAGNOSIS — M4003 Postural kyphosis, cervicothoracic region: Secondary | ICD-10-CM | POA: Diagnosis not present

## 2020-07-18 DIAGNOSIS — M542 Cervicalgia: Secondary | ICD-10-CM | POA: Diagnosis not present

## 2020-07-25 DIAGNOSIS — M4003 Postural kyphosis, cervicothoracic region: Secondary | ICD-10-CM | POA: Diagnosis not present

## 2020-07-25 DIAGNOSIS — M542 Cervicalgia: Secondary | ICD-10-CM | POA: Diagnosis not present

## 2020-07-26 DIAGNOSIS — M4302 Spondylolysis, cervical region: Secondary | ICD-10-CM | POA: Diagnosis not present

## 2020-07-26 DIAGNOSIS — M5020 Other cervical disc displacement, unspecified cervical region: Secondary | ICD-10-CM | POA: Diagnosis not present

## 2020-08-01 DIAGNOSIS — M542 Cervicalgia: Secondary | ICD-10-CM | POA: Diagnosis not present

## 2020-08-01 DIAGNOSIS — M4003 Postural kyphosis, cervicothoracic region: Secondary | ICD-10-CM | POA: Diagnosis not present

## 2020-08-02 DIAGNOSIS — M542 Cervicalgia: Secondary | ICD-10-CM | POA: Diagnosis not present

## 2020-08-08 DIAGNOSIS — M542 Cervicalgia: Secondary | ICD-10-CM | POA: Diagnosis not present

## 2020-08-08 DIAGNOSIS — M4003 Postural kyphosis, cervicothoracic region: Secondary | ICD-10-CM | POA: Diagnosis not present

## 2020-08-10 DIAGNOSIS — M4003 Postural kyphosis, cervicothoracic region: Secondary | ICD-10-CM | POA: Diagnosis not present

## 2020-08-10 DIAGNOSIS — M542 Cervicalgia: Secondary | ICD-10-CM | POA: Diagnosis not present

## 2020-08-15 DIAGNOSIS — M4003 Postural kyphosis, cervicothoracic region: Secondary | ICD-10-CM | POA: Diagnosis not present

## 2020-08-15 DIAGNOSIS — M542 Cervicalgia: Secondary | ICD-10-CM | POA: Diagnosis not present

## 2020-08-16 DIAGNOSIS — M542 Cervicalgia: Secondary | ICD-10-CM | POA: Diagnosis not present

## 2020-08-16 DIAGNOSIS — M4302 Spondylolysis, cervical region: Secondary | ICD-10-CM | POA: Diagnosis not present

## 2020-08-16 DIAGNOSIS — M7918 Myalgia, other site: Secondary | ICD-10-CM | POA: Diagnosis not present

## 2020-09-13 DIAGNOSIS — M4302 Spondylolysis, cervical region: Secondary | ICD-10-CM | POA: Diagnosis not present

## 2020-09-13 DIAGNOSIS — M542 Cervicalgia: Secondary | ICD-10-CM | POA: Diagnosis not present

## 2020-09-13 DIAGNOSIS — M7918 Myalgia, other site: Secondary | ICD-10-CM | POA: Diagnosis not present

## 2020-11-24 DIAGNOSIS — N951 Menopausal and female climacteric states: Secondary | ICD-10-CM | POA: Diagnosis not present

## 2020-11-24 DIAGNOSIS — G4700 Insomnia, unspecified: Secondary | ICD-10-CM | POA: Diagnosis not present

## 2020-11-24 DIAGNOSIS — Z6831 Body mass index (BMI) 31.0-31.9, adult: Secondary | ICD-10-CM | POA: Diagnosis not present

## 2020-11-24 DIAGNOSIS — R109 Unspecified abdominal pain: Secondary | ICD-10-CM | POA: Diagnosis not present

## 2020-12-23 DIAGNOSIS — R5383 Other fatigue: Secondary | ICD-10-CM | POA: Diagnosis not present

## 2020-12-23 DIAGNOSIS — N951 Menopausal and female climacteric states: Secondary | ICD-10-CM | POA: Diagnosis not present

## 2020-12-23 DIAGNOSIS — G4700 Insomnia, unspecified: Secondary | ICD-10-CM | POA: Diagnosis not present

## 2020-12-23 DIAGNOSIS — R12 Heartburn: Secondary | ICD-10-CM | POA: Diagnosis not present

## 2020-12-23 DIAGNOSIS — Z79899 Other long term (current) drug therapy: Secondary | ICD-10-CM | POA: Diagnosis not present

## 2021-01-23 DIAGNOSIS — E785 Hyperlipidemia, unspecified: Secondary | ICD-10-CM | POA: Diagnosis not present

## 2021-01-23 DIAGNOSIS — F419 Anxiety disorder, unspecified: Secondary | ICD-10-CM | POA: Diagnosis not present

## 2021-01-23 DIAGNOSIS — G4700 Insomnia, unspecified: Secondary | ICD-10-CM | POA: Diagnosis not present

## 2021-01-23 DIAGNOSIS — N951 Menopausal and female climacteric states: Secondary | ICD-10-CM | POA: Diagnosis not present

## 2021-02-22 DIAGNOSIS — F419 Anxiety disorder, unspecified: Secondary | ICD-10-CM | POA: Diagnosis not present

## 2021-02-22 DIAGNOSIS — F4310 Post-traumatic stress disorder, unspecified: Secondary | ICD-10-CM | POA: Diagnosis not present

## 2021-03-22 DIAGNOSIS — R635 Abnormal weight gain: Secondary | ICD-10-CM | POA: Diagnosis not present

## 2021-03-22 DIAGNOSIS — N951 Menopausal and female climacteric states: Secondary | ICD-10-CM | POA: Diagnosis not present

## 2021-03-27 DIAGNOSIS — Z1331 Encounter for screening for depression: Secondary | ICD-10-CM | POA: Diagnosis not present

## 2021-03-27 DIAGNOSIS — R945 Abnormal results of liver function studies: Secondary | ICD-10-CM | POA: Diagnosis not present

## 2021-03-27 DIAGNOSIS — Z1339 Encounter for screening examination for other mental health and behavioral disorders: Secondary | ICD-10-CM | POA: Diagnosis not present

## 2021-03-27 DIAGNOSIS — N951 Menopausal and female climacteric states: Secondary | ICD-10-CM | POA: Diagnosis not present

## 2021-03-27 DIAGNOSIS — R635 Abnormal weight gain: Secondary | ICD-10-CM | POA: Diagnosis not present

## 2021-03-27 DIAGNOSIS — Z6831 Body mass index (BMI) 31.0-31.9, adult: Secondary | ICD-10-CM | POA: Diagnosis not present

## 2021-03-28 DIAGNOSIS — N951 Menopausal and female climacteric states: Secondary | ICD-10-CM | POA: Diagnosis not present

## 2021-03-28 DIAGNOSIS — K219 Gastro-esophageal reflux disease without esophagitis: Secondary | ICD-10-CM | POA: Diagnosis not present

## 2021-03-28 DIAGNOSIS — Z6831 Body mass index (BMI) 31.0-31.9, adult: Secondary | ICD-10-CM | POA: Diagnosis not present

## 2021-04-06 DIAGNOSIS — Z789 Other specified health status: Secondary | ICD-10-CM | POA: Diagnosis not present

## 2021-04-06 DIAGNOSIS — Z6830 Body mass index (BMI) 30.0-30.9, adult: Secondary | ICD-10-CM | POA: Diagnosis not present

## 2021-04-13 DIAGNOSIS — K219 Gastro-esophageal reflux disease without esophagitis: Secondary | ICD-10-CM | POA: Diagnosis not present

## 2021-04-13 DIAGNOSIS — Z6829 Body mass index (BMI) 29.0-29.9, adult: Secondary | ICD-10-CM | POA: Diagnosis not present

## 2021-04-20 DIAGNOSIS — Z789 Other specified health status: Secondary | ICD-10-CM | POA: Diagnosis not present

## 2021-04-20 DIAGNOSIS — R945 Abnormal results of liver function studies: Secondary | ICD-10-CM | POA: Diagnosis not present

## 2021-04-20 DIAGNOSIS — Z6829 Body mass index (BMI) 29.0-29.9, adult: Secondary | ICD-10-CM | POA: Diagnosis not present

## 2021-05-01 IMAGING — CT CT HEAD W/O CM
3 series · 16 of 47 positions shown, 19 images · non-contrast
Comparison: None.

CLINICAL DATA: Focal neurologic deficit greater than 6 hours.
Suspect stroke. Reports bilateral leg pain and numbness.

EXAM:
CT HEAD WITHOUT CONTRAST
TECHNIQUE: Contiguous axial images were obtained from the base of the skull
through the vertex without intravenous contrast.

[Series 3: head 5.0 h30s · axial · 0.44mm/px · z∈[+1522,+1657]mm · 10 of 33 slices shown, 13 images]
[im 3/33  brain]
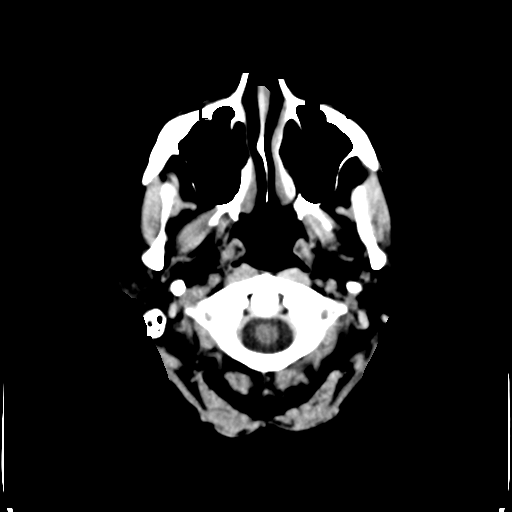
[im 3/33  bone]
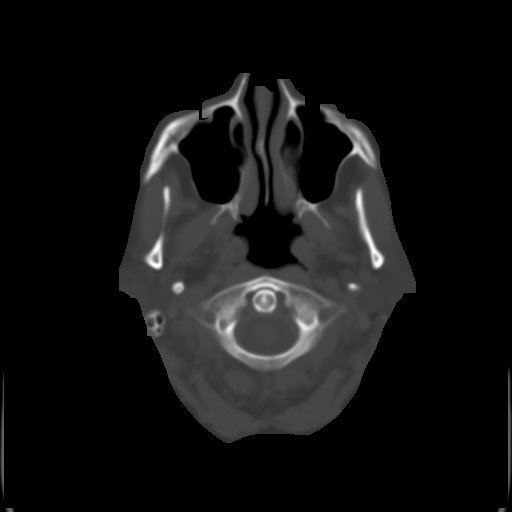
[im 6/33  brain]
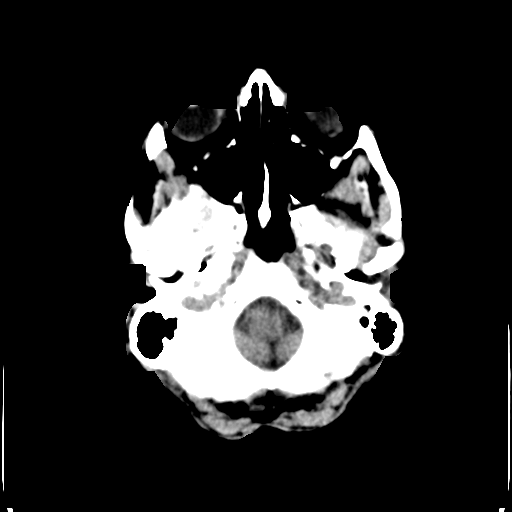
[im 9/33  brain]
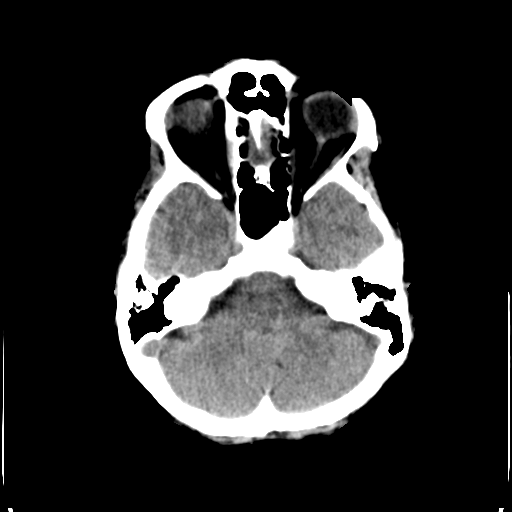
[im 12/33  brain]
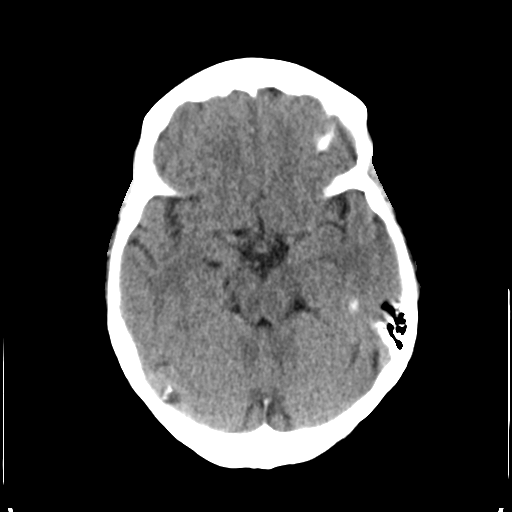
[im 15/33  brain]
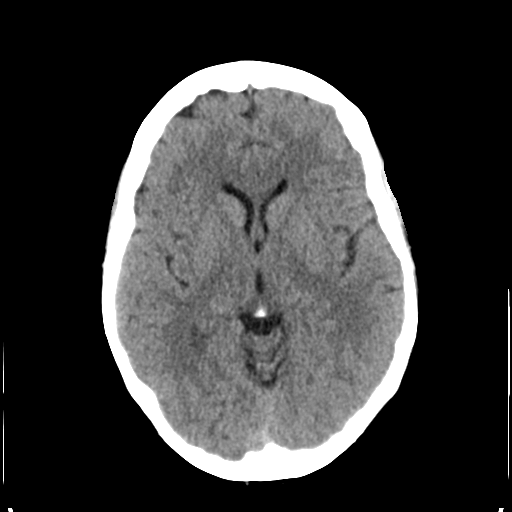
[im 15/33  bone]
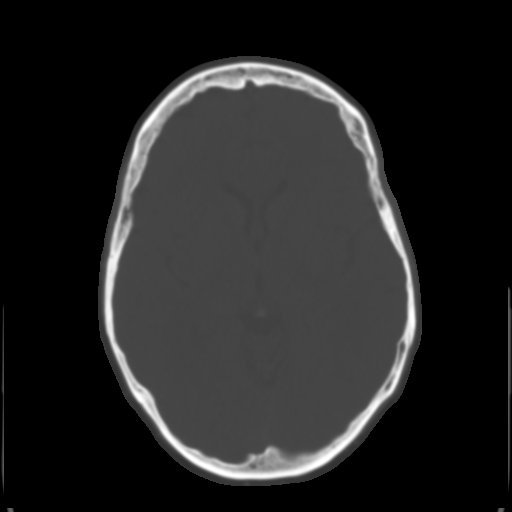
[im 18/33  brain]
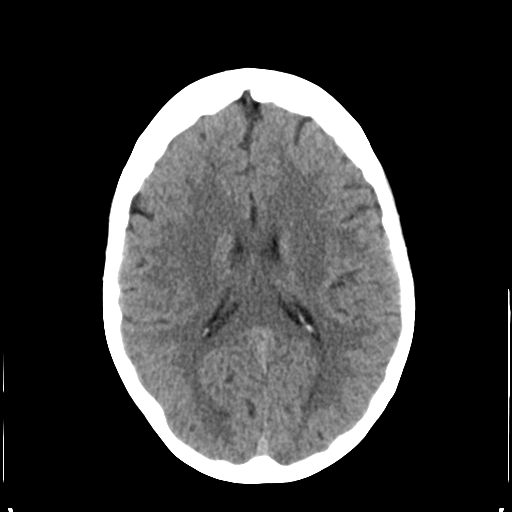
[im 21/33  brain]
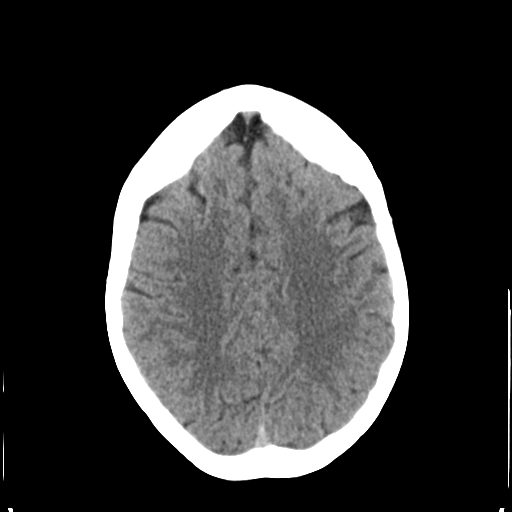
[im 25/33  brain]
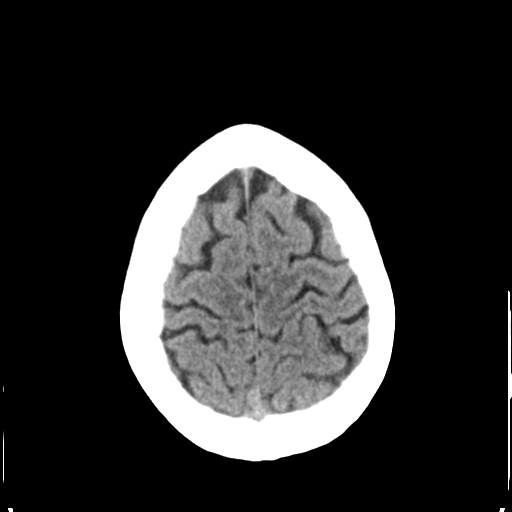
[im 27/33  brain]
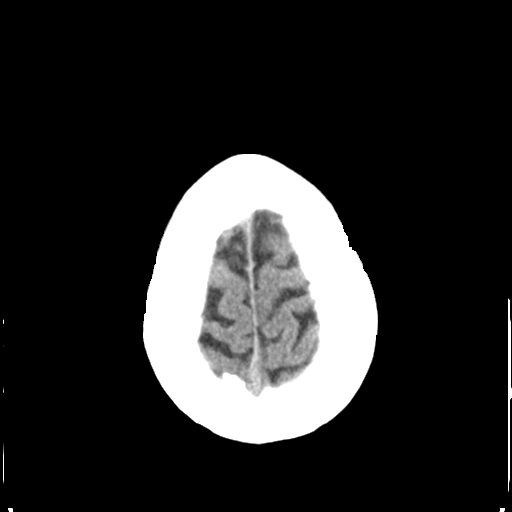
[im 27/33  bone]
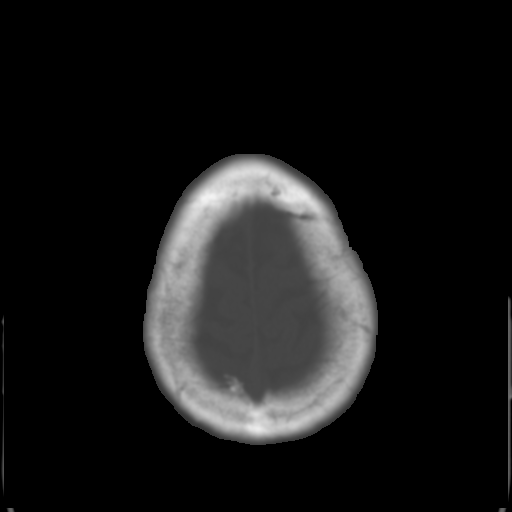
[im 30/33  brain]
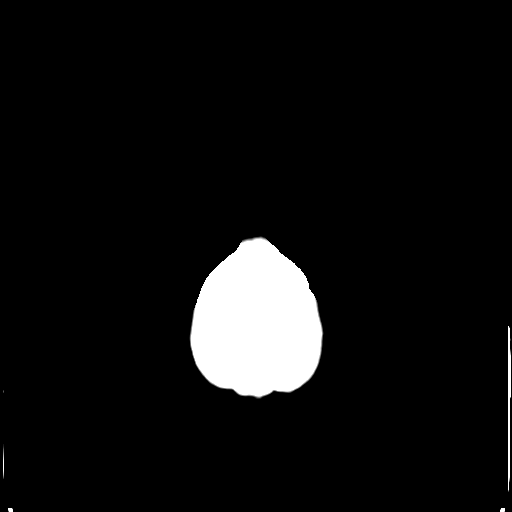

[Series 5: head 3.0 mpr cor · coronal · 0.30mm/px · 3 of 67 slices shown]
[im 23/67  brain]
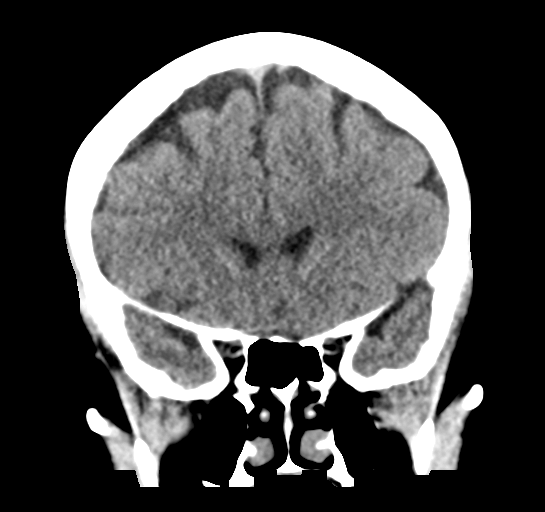
[im 30/67  brain]
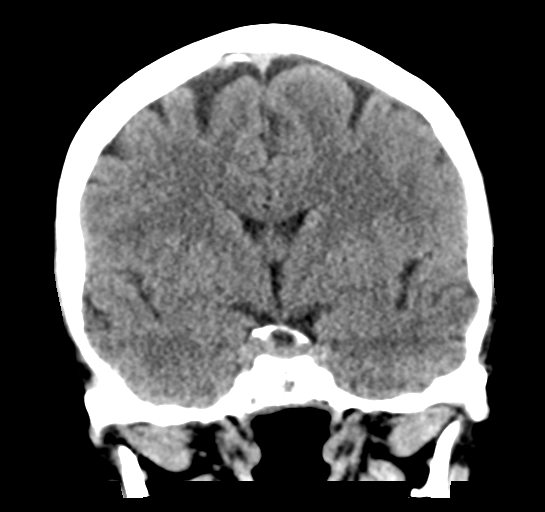
[im 37/67  brain]
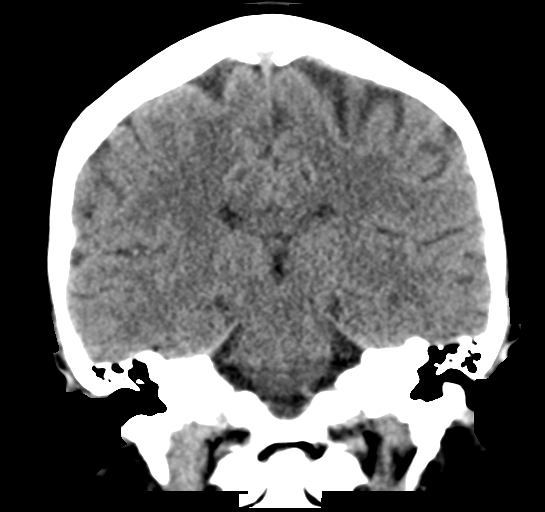

[Series 6: head 3.0 mpr sag · sagittal · 0.34mm/px · 3 of 66 slices shown]
[im 22/66  brain]
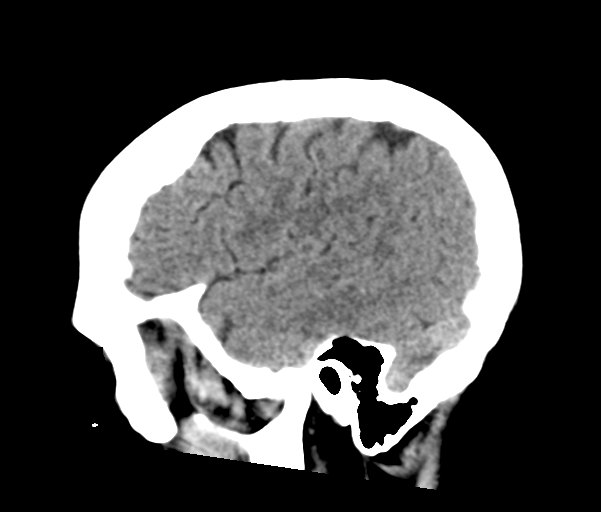
[im 33/66  brain]
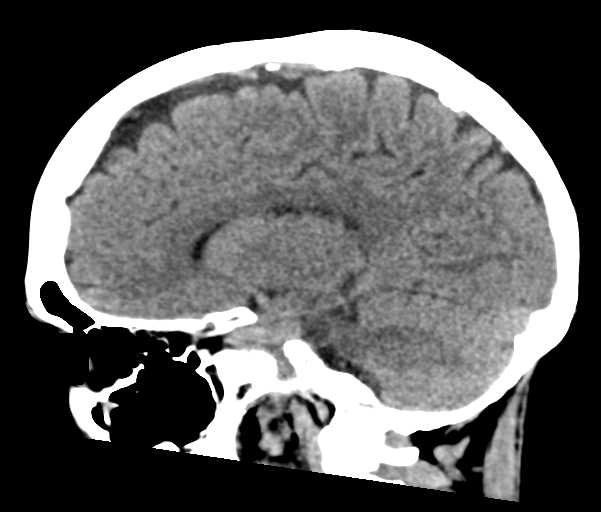
[im 44/66  brain]
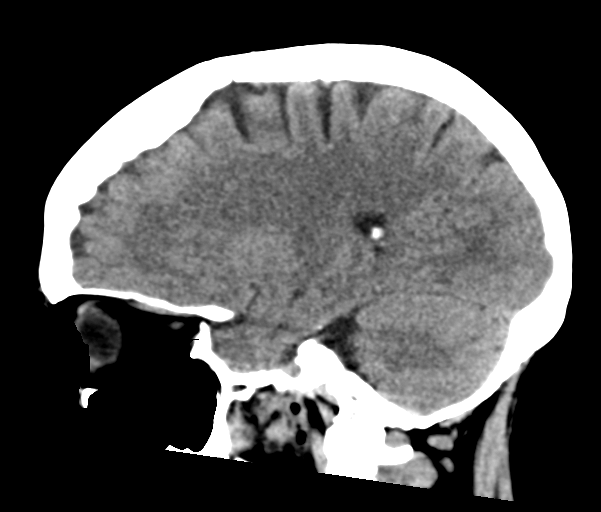

[16 of 47 positions shown; findings below may reference images not displayed]

FINDINGS: Brain: No evidence of acute infarction, hemorrhage, hydrocephalus,
extra-axial collection or mass lesion/mass effect.

Vascular: No hyperdense vessel or unexpected calcification.

Skull: Normal. Negative for fracture or focal lesion.

Sinuses/Orbits: No acute finding.

Other: None.
IMPRESSION: Normal brain.  No acute intracranial abnormality.

## 2021-05-01 IMAGING — MR MR HEAD W/O CM
10 of 11 series · 43 of 48 positions shown · non-contrast
Comparison: Prior head CT from 12/03/2019.

CLINICAL DATA: Initial evaluation for acute bilateral leg pain,
progressive neurologic deficit.

EXAM:
MRI HEAD WITHOUT CONTRAST
TECHNIQUE: Multiplanar, multiecho pulse sequences of the brain and surrounding
structures were obtained without intravenous contrast.

[Series 5: DWI · axial · 3.0mm · 0.88mm/px · z∈[-59,+79]mm · 10 of 94 slices shown (1 of 4)]
[im 1/94]
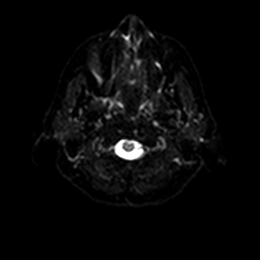
[im 11/94]
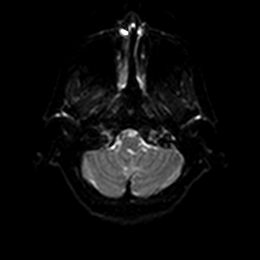
[im 21/94]
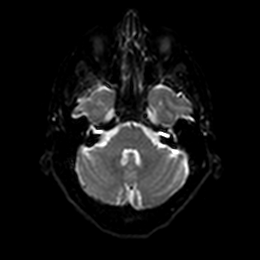
[im 32/94]
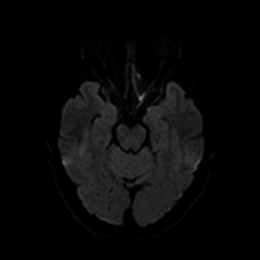
[im 42/94]
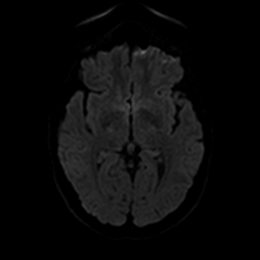
[im 52/94]
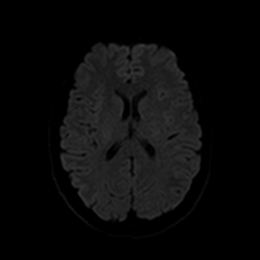
[im 63/94]
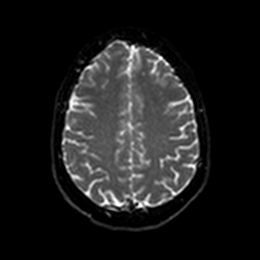
[im 73/94]
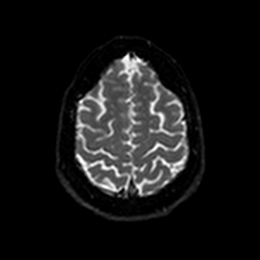
[im 83/94]
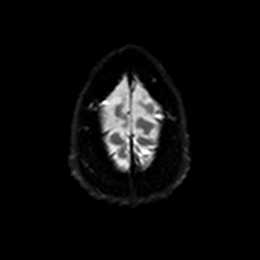
[im 94/94]
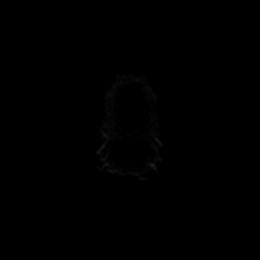

[Series 6: DWI · axial · 3.0mm · 0.88mm/px · z∈[-59,+79]mm · 5 of 47 slices shown (2 of 4)]
[im 1/47]
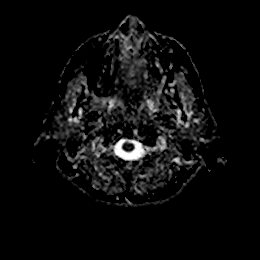
[im 12/47]
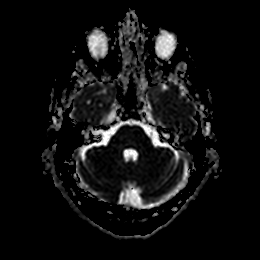
[im 24/47]
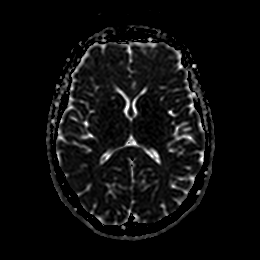
[im 35/47]
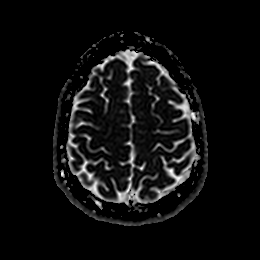
[im 47/47]
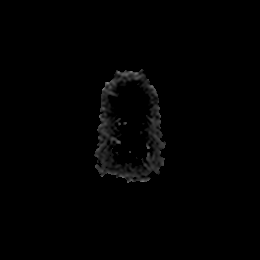

[Series 7: DWI · coronal · 4.0mm · 0.88mm/px · 6 of 70 slices shown (3 of 4)]
[im 1/70]
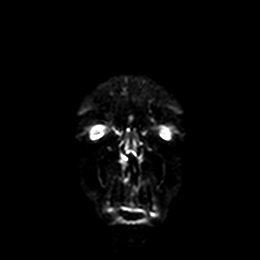
[im 14/70]
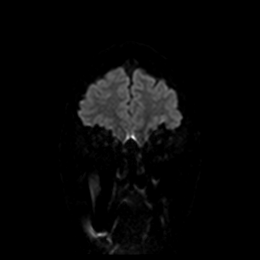
[im 28/70]
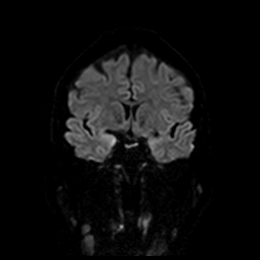
[im 42/70]
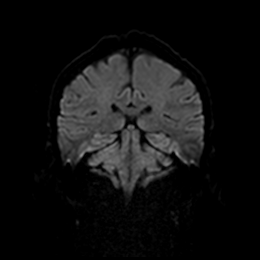
[im 56/70]
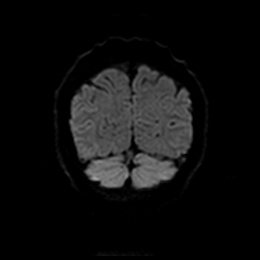
[im 70/70]
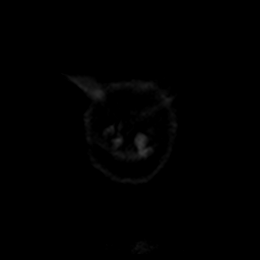

[Series 8: DWI · coronal · 4.0mm · 0.88mm/px · 3 of 35 slices shown (4 of 4)]
[im 1/35]
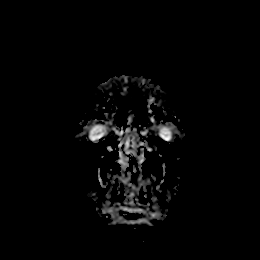
[im 18/35]
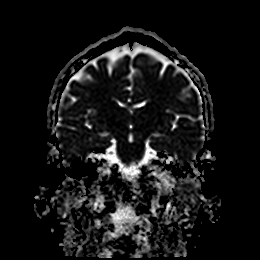
[im 35/35]
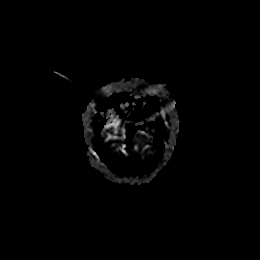

[Series 9: T1 · sagittal · 5.0mm · 0.75mm/px · 2 of 25 slices shown]
[im 1/25]
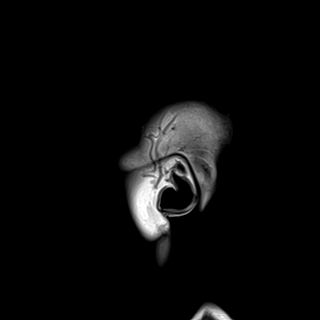
[im 25/25]
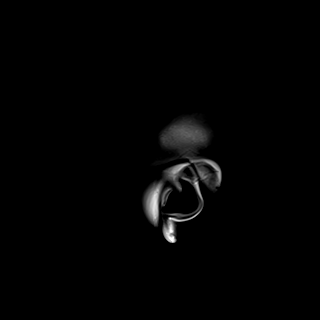

[Series 10: T2 · axial · 5.0mm · 0.72mm/px · z∈[-62,+82]mm · 2 of 25 slices shown (1 of 2)]
[im 1/25]
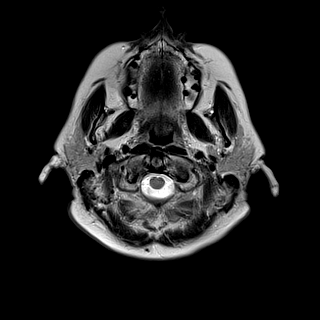
[im 25/25]
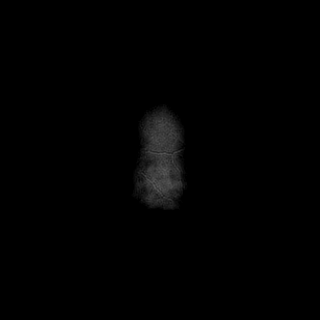

[Series 11: FLAIR · axial · 5.0mm · 0.45mm/px · z∈[-62,+82]mm · 2 of 25 slices shown]
[im 1/25]
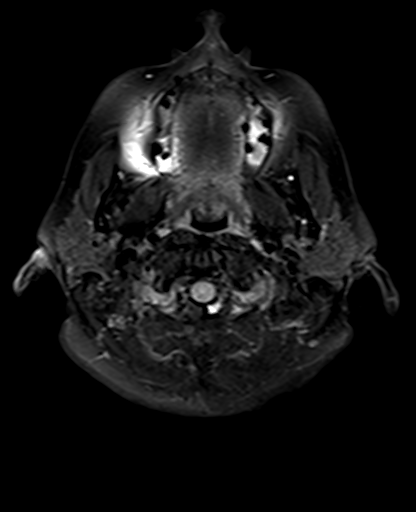
[im 25/25]
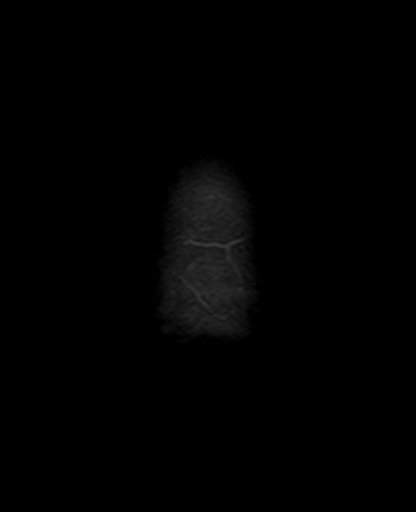

[Series 13: pha_images · axial · 3.0mm · 0.90mm/px · z∈[-78,+96]mm · 5 of 58 slices shown]
[im 1/58]
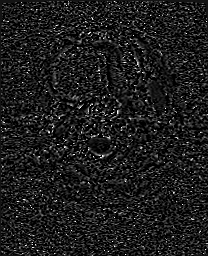
[im 15/58]
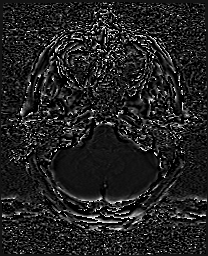
[im 29/58]
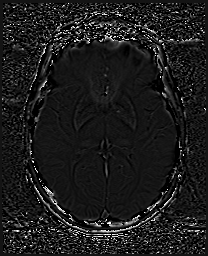
[im 43/58]
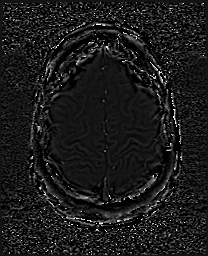
[im 58/58]
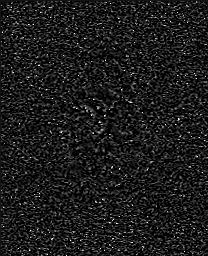

[Series 14: swi_images · axial · 3.0mm · 0.90mm/px · z∈[-78,+99]mm · 5 of 60 slices shown]
[im 1/60]
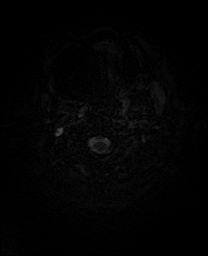
[im 15/60]
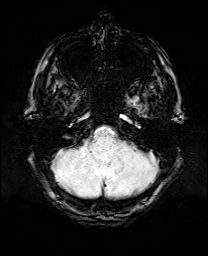
[im 30/60]
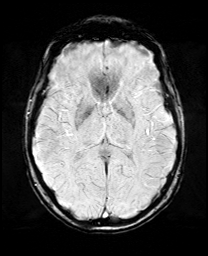
[im 45/60]
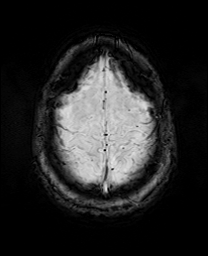
[im 60/60]
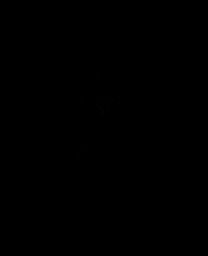

[Series 17: T2 · coronal · 5.0mm · 0.34mm/px · 3 of 29 slices shown (2 of 2)]
[im 1/29]
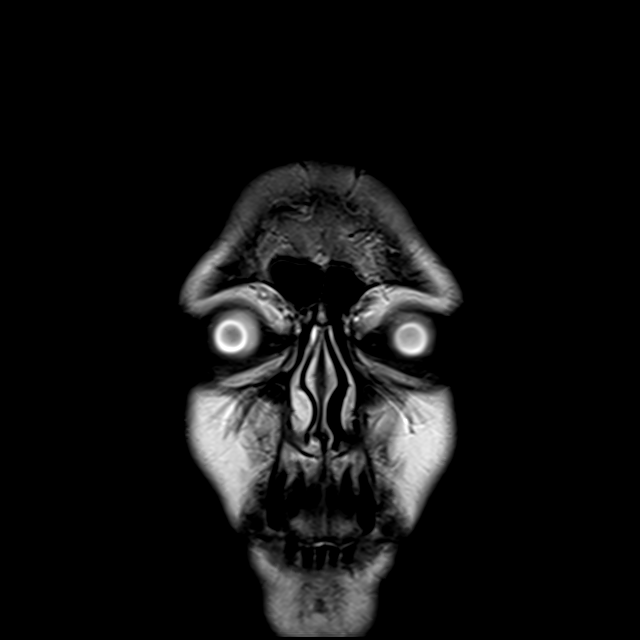
[im 15/29]
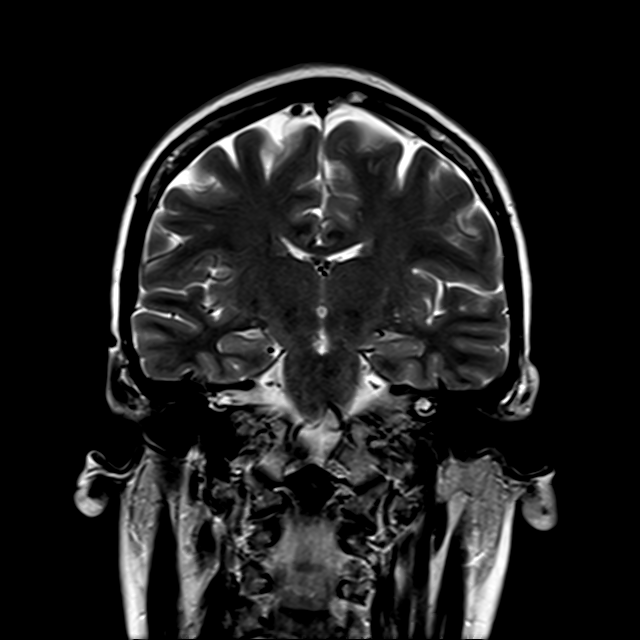
[im 29/29]
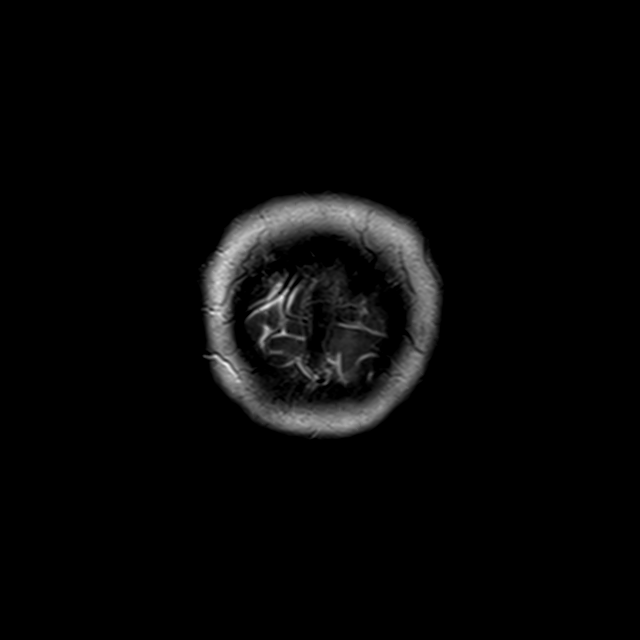

[43 of 48 positions shown; findings below may reference images not displayed]

FINDINGS: Brain: Cerebral volume within normal limits for patient age. Few
scattered subcentimeter foci of T2/FLAIR hyperintensity noted
involving the deep and subcortical white matter both cerebral
hemispheres, nonspecific, but felt to be within normal limits for
age.

No abnormal foci of restricted diffusion to suggest acute or
subacute ischemia. Gray-white matter differentiation well
maintained. No encephalomalacia to suggest chronic infarction. No
foci of susceptibility artifact to suggest acute or chronic
intracranial hemorrhage.

No mass lesion, midline shift or mass effect. No hydrocephalus. No
extra-axial fluid collection. Major dural sinuses are grossly
patent.

Pituitary gland and suprasellar region are normal. Midline
structures intact and normal.

Vascular: Major intracranial vascular flow voids well maintained and
normal in appearance.

Skull and upper cervical spine: Craniocervical junction normal.
Visualized upper cervical spine within normal limits. Bone marrow
signal intensity normal. No scalp soft tissue abnormality.

Sinuses/Orbits: Globes and orbital soft tissues within normal
limits.

Paranasal sinuses are clear. No mastoid effusion. Inner ear
structures normal.

Other: None.
IMPRESSION: Normal brain MRI for age. No acute intracranial abnormality
identified.

## 2021-05-04 DIAGNOSIS — Z79899 Other long term (current) drug therapy: Secondary | ICD-10-CM | POA: Diagnosis not present

## 2021-05-04 DIAGNOSIS — E785 Hyperlipidemia, unspecified: Secondary | ICD-10-CM | POA: Diagnosis not present

## 2021-05-04 DIAGNOSIS — G4700 Insomnia, unspecified: Secondary | ICD-10-CM | POA: Diagnosis not present

## 2021-05-04 DIAGNOSIS — B351 Tinea unguium: Secondary | ICD-10-CM | POA: Diagnosis not present

## 2021-05-08 DIAGNOSIS — Z6829 Body mass index (BMI) 29.0-29.9, adult: Secondary | ICD-10-CM | POA: Diagnosis not present

## 2021-05-08 DIAGNOSIS — K219 Gastro-esophageal reflux disease without esophagitis: Secondary | ICD-10-CM | POA: Diagnosis not present

## 2021-05-18 DIAGNOSIS — Z789 Other specified health status: Secondary | ICD-10-CM | POA: Diagnosis not present

## 2021-05-18 DIAGNOSIS — Z6829 Body mass index (BMI) 29.0-29.9, adult: Secondary | ICD-10-CM | POA: Diagnosis not present

## 2021-05-25 DIAGNOSIS — R0602 Shortness of breath: Secondary | ICD-10-CM | POA: Diagnosis not present

## 2021-05-25 DIAGNOSIS — R635 Abnormal weight gain: Secondary | ICD-10-CM | POA: Diagnosis not present

## 2021-05-25 DIAGNOSIS — Z6829 Body mass index (BMI) 29.0-29.9, adult: Secondary | ICD-10-CM | POA: Diagnosis not present

## 2021-05-25 DIAGNOSIS — R5383 Other fatigue: Secondary | ICD-10-CM | POA: Diagnosis not present

## 2021-06-22 DIAGNOSIS — R12 Heartburn: Secondary | ICD-10-CM | POA: Diagnosis not present

## 2021-06-22 DIAGNOSIS — R635 Abnormal weight gain: Secondary | ICD-10-CM | POA: Diagnosis not present

## 2021-06-22 DIAGNOSIS — E785 Hyperlipidemia, unspecified: Secondary | ICD-10-CM | POA: Diagnosis not present

## 2021-06-22 DIAGNOSIS — Z6827 Body mass index (BMI) 27.0-27.9, adult: Secondary | ICD-10-CM | POA: Diagnosis not present

## 2021-08-07 DIAGNOSIS — G4700 Insomnia, unspecified: Secondary | ICD-10-CM | POA: Diagnosis not present

## 2021-08-07 DIAGNOSIS — Z79899 Other long term (current) drug therapy: Secondary | ICD-10-CM | POA: Diagnosis not present

## 2021-08-07 DIAGNOSIS — R635 Abnormal weight gain: Secondary | ICD-10-CM | POA: Diagnosis not present

## 2021-08-07 DIAGNOSIS — E785 Hyperlipidemia, unspecified: Secondary | ICD-10-CM | POA: Diagnosis not present

## 2021-08-07 DIAGNOSIS — N951 Menopausal and female climacteric states: Secondary | ICD-10-CM | POA: Diagnosis not present

## 2021-08-19 DIAGNOSIS — H9202 Otalgia, left ear: Secondary | ICD-10-CM | POA: Diagnosis not present

## 2021-09-08 DIAGNOSIS — R12 Heartburn: Secondary | ICD-10-CM | POA: Diagnosis not present

## 2021-09-08 DIAGNOSIS — G4700 Insomnia, unspecified: Secondary | ICD-10-CM | POA: Diagnosis not present

## 2021-09-08 DIAGNOSIS — R635 Abnormal weight gain: Secondary | ICD-10-CM | POA: Diagnosis not present

## 2021-09-08 DIAGNOSIS — E785 Hyperlipidemia, unspecified: Secondary | ICD-10-CM | POA: Diagnosis not present

## 2021-09-26 DIAGNOSIS — J029 Acute pharyngitis, unspecified: Secondary | ICD-10-CM | POA: Diagnosis not present

## 2021-09-26 DIAGNOSIS — R051 Acute cough: Secondary | ICD-10-CM | POA: Diagnosis not present

## 2021-09-26 DIAGNOSIS — J209 Acute bronchitis, unspecified: Secondary | ICD-10-CM | POA: Diagnosis not present

## 2021-09-26 DIAGNOSIS — R0981 Nasal congestion: Secondary | ICD-10-CM | POA: Diagnosis not present

## 2021-09-26 DIAGNOSIS — Z20828 Contact with and (suspected) exposure to other viral communicable diseases: Secondary | ICD-10-CM | POA: Diagnosis not present

## 2021-10-05 DIAGNOSIS — R635 Abnormal weight gain: Secondary | ICD-10-CM | POA: Diagnosis not present

## 2021-10-05 DIAGNOSIS — G4700 Insomnia, unspecified: Secondary | ICD-10-CM | POA: Diagnosis not present

## 2021-10-05 DIAGNOSIS — Z6825 Body mass index (BMI) 25.0-25.9, adult: Secondary | ICD-10-CM | POA: Diagnosis not present

## 2021-11-09 DIAGNOSIS — Z6825 Body mass index (BMI) 25.0-25.9, adult: Secondary | ICD-10-CM | POA: Diagnosis not present

## 2021-11-09 DIAGNOSIS — R635 Abnormal weight gain: Secondary | ICD-10-CM | POA: Diagnosis not present

## 2021-11-09 DIAGNOSIS — G4700 Insomnia, unspecified: Secondary | ICD-10-CM | POA: Diagnosis not present

## 2021-11-14 DIAGNOSIS — N951 Menopausal and female climacteric states: Secondary | ICD-10-CM | POA: Diagnosis not present

## 2021-11-21 DIAGNOSIS — Z6826 Body mass index (BMI) 26.0-26.9, adult: Secondary | ICD-10-CM | POA: Diagnosis not present

## 2021-11-21 DIAGNOSIS — N951 Menopausal and female climacteric states: Secondary | ICD-10-CM | POA: Diagnosis not present

## 2021-11-21 DIAGNOSIS — G4700 Insomnia, unspecified: Secondary | ICD-10-CM | POA: Diagnosis not present

## 2021-11-21 DIAGNOSIS — R232 Flushing: Secondary | ICD-10-CM | POA: Diagnosis not present

## 2021-12-08 DIAGNOSIS — R635 Abnormal weight gain: Secondary | ICD-10-CM | POA: Diagnosis not present

## 2021-12-08 DIAGNOSIS — Z6826 Body mass index (BMI) 26.0-26.9, adult: Secondary | ICD-10-CM | POA: Diagnosis not present

## 2021-12-08 DIAGNOSIS — G4700 Insomnia, unspecified: Secondary | ICD-10-CM | POA: Diagnosis not present

## 2021-12-11 DIAGNOSIS — L603 Nail dystrophy: Secondary | ICD-10-CM | POA: Diagnosis not present

## 2021-12-11 DIAGNOSIS — L608 Other nail disorders: Secondary | ICD-10-CM | POA: Diagnosis not present

## 2022-01-05 DIAGNOSIS — S9031XA Contusion of right foot, initial encounter: Secondary | ICD-10-CM | POA: Diagnosis not present

## 2022-01-17 DIAGNOSIS — B3783 Candidal cheilitis: Secondary | ICD-10-CM | POA: Diagnosis not present

## 2022-01-17 DIAGNOSIS — D225 Melanocytic nevi of trunk: Secondary | ICD-10-CM | POA: Diagnosis not present

## 2022-01-17 DIAGNOSIS — B079 Viral wart, unspecified: Secondary | ICD-10-CM | POA: Diagnosis not present

## 2022-03-02 DIAGNOSIS — Z6828 Body mass index (BMI) 28.0-28.9, adult: Secondary | ICD-10-CM | POA: Diagnosis not present

## 2022-03-02 DIAGNOSIS — G4700 Insomnia, unspecified: Secondary | ICD-10-CM | POA: Diagnosis not present

## 2022-03-02 DIAGNOSIS — R635 Abnormal weight gain: Secondary | ICD-10-CM | POA: Diagnosis not present

## 2022-06-06 DIAGNOSIS — R059 Cough, unspecified: Secondary | ICD-10-CM | POA: Diagnosis not present

## 2022-06-06 DIAGNOSIS — R519 Headache, unspecified: Secondary | ICD-10-CM | POA: Diagnosis not present

## 2022-06-06 DIAGNOSIS — R0981 Nasal congestion: Secondary | ICD-10-CM | POA: Diagnosis not present

## 2022-06-06 DIAGNOSIS — M791 Myalgia, unspecified site: Secondary | ICD-10-CM | POA: Diagnosis not present

## 2022-06-06 DIAGNOSIS — R509 Fever, unspecified: Secondary | ICD-10-CM | POA: Diagnosis not present

## 2022-09-20 DIAGNOSIS — L299 Pruritus, unspecified: Secondary | ICD-10-CM | POA: Diagnosis not present

## 2022-09-20 DIAGNOSIS — L209 Atopic dermatitis, unspecified: Secondary | ICD-10-CM | POA: Diagnosis not present

## 2022-10-19 DIAGNOSIS — R5381 Other malaise: Secondary | ICD-10-CM | POA: Diagnosis not present

## 2022-10-19 DIAGNOSIS — R635 Abnormal weight gain: Secondary | ICD-10-CM | POA: Diagnosis not present

## 2022-10-19 DIAGNOSIS — N951 Menopausal and female climacteric states: Secondary | ICD-10-CM | POA: Diagnosis not present

## 2022-10-22 DIAGNOSIS — L299 Pruritus, unspecified: Secondary | ICD-10-CM | POA: Diagnosis not present

## 2022-10-22 DIAGNOSIS — R21 Rash and other nonspecific skin eruption: Secondary | ICD-10-CM | POA: Diagnosis not present

## 2022-11-19 DIAGNOSIS — L308 Other specified dermatitis: Secondary | ICD-10-CM | POA: Diagnosis not present

## 2022-11-19 DIAGNOSIS — D225 Melanocytic nevi of trunk: Secondary | ICD-10-CM | POA: Diagnosis not present

## 2022-11-19 DIAGNOSIS — L821 Other seborrheic keratosis: Secondary | ICD-10-CM | POA: Diagnosis not present

## 2022-12-25 DIAGNOSIS — F5101 Primary insomnia: Secondary | ICD-10-CM | POA: Diagnosis not present

## 2023-02-15 DIAGNOSIS — F5101 Primary insomnia: Secondary | ICD-10-CM | POA: Diagnosis not present

## 2023-02-15 DIAGNOSIS — R030 Elevated blood-pressure reading, without diagnosis of hypertension: Secondary | ICD-10-CM | POA: Diagnosis not present

## 2023-03-03 DIAGNOSIS — J209 Acute bronchitis, unspecified: Secondary | ICD-10-CM | POA: Diagnosis not present

## 2023-03-03 DIAGNOSIS — J029 Acute pharyngitis, unspecified: Secondary | ICD-10-CM | POA: Diagnosis not present

## 2023-03-18 DIAGNOSIS — E669 Obesity, unspecified: Secondary | ICD-10-CM | POA: Diagnosis not present

## 2023-03-18 DIAGNOSIS — Z713 Dietary counseling and surveillance: Secondary | ICD-10-CM | POA: Diagnosis not present

## 2023-03-18 DIAGNOSIS — F5101 Primary insomnia: Secondary | ICD-10-CM | POA: Diagnosis not present

## 2023-03-18 DIAGNOSIS — Z6830 Body mass index (BMI) 30.0-30.9, adult: Secondary | ICD-10-CM | POA: Diagnosis not present

## 2023-04-22 DIAGNOSIS — Z713 Dietary counseling and surveillance: Secondary | ICD-10-CM | POA: Diagnosis not present

## 2023-04-22 DIAGNOSIS — Z6828 Body mass index (BMI) 28.0-28.9, adult: Secondary | ICD-10-CM | POA: Diagnosis not present

## 2023-05-02 DIAGNOSIS — R051 Acute cough: Secondary | ICD-10-CM | POA: Diagnosis not present

## 2023-05-02 DIAGNOSIS — U071 COVID-19: Secondary | ICD-10-CM | POA: Diagnosis not present

## 2023-05-20 DIAGNOSIS — Z713 Dietary counseling and surveillance: Secondary | ICD-10-CM | POA: Diagnosis not present

## 2023-06-05 DIAGNOSIS — H6693 Otitis media, unspecified, bilateral: Secondary | ICD-10-CM | POA: Diagnosis not present

## 2023-06-05 DIAGNOSIS — J019 Acute sinusitis, unspecified: Secondary | ICD-10-CM | POA: Diagnosis not present

## 2023-06-05 DIAGNOSIS — R0981 Nasal congestion: Secondary | ICD-10-CM | POA: Diagnosis not present

## 2023-06-05 DIAGNOSIS — R051 Acute cough: Secondary | ICD-10-CM | POA: Diagnosis not present

## 2023-06-11 DIAGNOSIS — Z713 Dietary counseling and surveillance: Secondary | ICD-10-CM | POA: Diagnosis not present

## 2023-07-09 DIAGNOSIS — Z713 Dietary counseling and surveillance: Secondary | ICD-10-CM | POA: Diagnosis not present

## 2023-07-09 DIAGNOSIS — E663 Overweight: Secondary | ICD-10-CM | POA: Diagnosis not present

## 2023-07-09 DIAGNOSIS — Z6827 Body mass index (BMI) 27.0-27.9, adult: Secondary | ICD-10-CM | POA: Diagnosis not present

## 2023-08-05 DIAGNOSIS — Z713 Dietary counseling and surveillance: Secondary | ICD-10-CM | POA: Diagnosis not present

## 2023-08-05 DIAGNOSIS — Z6826 Body mass index (BMI) 26.0-26.9, adult: Secondary | ICD-10-CM | POA: Diagnosis not present

## 2023-09-02 DIAGNOSIS — Z713 Dietary counseling and surveillance: Secondary | ICD-10-CM | POA: Diagnosis not present

## 2023-09-02 DIAGNOSIS — Z6825 Body mass index (BMI) 25.0-25.9, adult: Secondary | ICD-10-CM | POA: Diagnosis not present

## 2023-10-01 DIAGNOSIS — Z0000 Encounter for general adult medical examination without abnormal findings: Secondary | ICD-10-CM | POA: Diagnosis not present

## 2023-10-01 DIAGNOSIS — Z6824 Body mass index (BMI) 24.0-24.9, adult: Secondary | ICD-10-CM | POA: Diagnosis not present

## 2023-10-11 DIAGNOSIS — Z1231 Encounter for screening mammogram for malignant neoplasm of breast: Secondary | ICD-10-CM | POA: Diagnosis not present

## 2023-10-24 DIAGNOSIS — Z1231 Encounter for screening mammogram for malignant neoplasm of breast: Secondary | ICD-10-CM | POA: Diagnosis not present

## 2023-10-25 DIAGNOSIS — H1089 Other conjunctivitis: Secondary | ICD-10-CM | POA: Diagnosis not present

## 2023-10-30 DIAGNOSIS — H1013 Acute atopic conjunctivitis, bilateral: Secondary | ICD-10-CM | POA: Diagnosis not present

## 2023-11-11 DIAGNOSIS — F5101 Primary insomnia: Secondary | ICD-10-CM | POA: Diagnosis not present

## 2023-11-12 DIAGNOSIS — L814 Other melanin hyperpigmentation: Secondary | ICD-10-CM | POA: Diagnosis not present

## 2023-11-12 DIAGNOSIS — L821 Other seborrheic keratosis: Secondary | ICD-10-CM | POA: Diagnosis not present

## 2023-11-12 DIAGNOSIS — L578 Other skin changes due to chronic exposure to nonionizing radiation: Secondary | ICD-10-CM | POA: Diagnosis not present

## 2023-11-12 DIAGNOSIS — D225 Melanocytic nevi of trunk: Secondary | ICD-10-CM | POA: Diagnosis not present

## 2023-11-14 DIAGNOSIS — H1033 Unspecified acute conjunctivitis, bilateral: Secondary | ICD-10-CM | POA: Diagnosis not present

## 2023-11-24 DIAGNOSIS — Z1212 Encounter for screening for malignant neoplasm of rectum: Secondary | ICD-10-CM | POA: Diagnosis not present

## 2023-11-24 DIAGNOSIS — Z1211 Encounter for screening for malignant neoplasm of colon: Secondary | ICD-10-CM | POA: Diagnosis not present

## 2024-01-10 DIAGNOSIS — E669 Obesity, unspecified: Secondary | ICD-10-CM | POA: Diagnosis not present

## 2024-08-19 DIAGNOSIS — R059 Cough, unspecified: Secondary | ICD-10-CM | POA: Diagnosis not present

## 2024-08-19 DIAGNOSIS — R0981 Nasal congestion: Secondary | ICD-10-CM | POA: Diagnosis not present
